# Patient Record
Sex: Female | Born: 1970 | Race: White | Hispanic: No | Smoking: Current every day smoker
Health system: Southern US, Community
[De-identification: ages and names within clinical notes are randomized; demographics above are authoritative.]

## PROBLEM LIST (undated history)

## (undated) ENCOUNTER — Emergency Department (HOSPITAL_COMMUNITY): Disposition: A | Discharge status: Home / Self Care | Payer: 59

## (undated) HISTORY — PX: ABLATION: SHX5711

---

## 2003-06-11 ENCOUNTER — Ambulatory Visit (HOSPITAL_COMMUNITY): Admission: RE | Admit: 2003-06-11 | Discharge: 2003-06-11 | Payer: Self-pay | Admitting: Obstetrics & Gynecology

## 2003-12-27 ENCOUNTER — Inpatient Hospital Stay (HOSPITAL_COMMUNITY): Admission: AD | Admit: 2003-12-27 | Discharge: 2003-12-30 | Payer: Self-pay | Admitting: Obstetrics

## 2003-12-31 ENCOUNTER — Encounter: Admission: RE | Admit: 2003-12-31 | Discharge: 2004-01-30 | Payer: Self-pay | Admitting: Obstetrics

## 2004-01-31 ENCOUNTER — Encounter: Admission: RE | Admit: 2004-01-31 | Discharge: 2004-03-01 | Payer: Self-pay | Admitting: Obstetrics

## 2004-04-01 ENCOUNTER — Encounter: Admission: RE | Admit: 2004-04-01 | Discharge: 2004-05-01 | Payer: Self-pay | Admitting: Obstetrics

## 2004-05-02 ENCOUNTER — Encounter: Admission: RE | Admit: 2004-05-02 | Discharge: 2004-06-01 | Payer: Self-pay | Admitting: Obstetrics

## 2004-06-30 ENCOUNTER — Encounter: Admission: RE | Admit: 2004-06-30 | Discharge: 2004-07-30 | Payer: Self-pay | Admitting: Obstetrics

## 2004-07-14 ENCOUNTER — Emergency Department (HOSPITAL_COMMUNITY): Admission: EM | Admit: 2004-07-14 | Discharge: 2004-07-14 | Payer: Self-pay | Admitting: Family Medicine

## 2004-08-30 ENCOUNTER — Encounter: Admission: RE | Admit: 2004-08-30 | Discharge: 2004-09-29 | Payer: Self-pay | Admitting: Obstetrics

## 2004-10-30 ENCOUNTER — Encounter: Admission: RE | Admit: 2004-10-30 | Discharge: 2004-11-29 | Payer: Self-pay | Admitting: Obstetrics

## 2004-11-30 ENCOUNTER — Encounter: Admission: RE | Admit: 2004-11-30 | Discharge: 2004-12-29 | Payer: Self-pay | Admitting: Obstetrics

## 2004-12-30 ENCOUNTER — Encounter: Admission: RE | Admit: 2004-12-30 | Discharge: 2005-01-29 | Payer: Self-pay | Admitting: Obstetrics

## 2005-01-30 ENCOUNTER — Encounter: Admission: RE | Admit: 2005-01-30 | Discharge: 2005-02-28 | Payer: Self-pay | Admitting: Obstetrics

## 2005-03-01 ENCOUNTER — Encounter: Admission: RE | Admit: 2005-03-01 | Discharge: 2005-03-31 | Payer: Self-pay | Admitting: Obstetrics

## 2005-04-01 ENCOUNTER — Encounter: Admission: RE | Admit: 2005-04-01 | Discharge: 2005-05-01 | Payer: Self-pay | Admitting: Obstetrics

## 2005-05-02 ENCOUNTER — Encounter: Admission: RE | Admit: 2005-05-02 | Discharge: 2005-05-29 | Payer: Self-pay | Admitting: Obstetrics

## 2005-05-30 ENCOUNTER — Encounter: Admission: RE | Admit: 2005-05-30 | Discharge: 2005-06-29 | Payer: Self-pay | Admitting: Obstetrics

## 2005-06-30 ENCOUNTER — Encounter: Admission: RE | Admit: 2005-06-30 | Discharge: 2005-07-24 | Payer: Self-pay | Admitting: Obstetrics

## 2008-03-14 ENCOUNTER — Observation Stay (HOSPITAL_COMMUNITY): Admission: AD | Admit: 2008-03-14 | Discharge: 2008-03-14 | Payer: Self-pay | Admitting: Obstetrics and Gynecology

## 2008-03-21 ENCOUNTER — Ambulatory Visit: Payer: Self-pay | Admitting: Hematology and Oncology

## 2008-03-30 LAB — URINALYSIS, MICROSCOPIC - CHCC
Bacteria, UA: NEGATIVE
Bilirubin (Urine): NEGATIVE
Blood: NEGATIVE
Glucose: NEGATIVE g/dL
Nitrite: NEGATIVE
Specific Gravity, Urine: 1.02 (ref 1.003–1.035)

## 2008-03-30 LAB — CBC & DIFF AND RETIC
BASO%: 0.4 % (ref 0.0–2.0)
Basophils Absolute: 0 10*3/uL (ref 0.0–0.1)
HCT: 38.6 % (ref 34.8–46.6)
HGB: 13 g/dL (ref 11.6–15.9)
IRF: 0.29 (ref 0.130–0.330)
LYMPH%: 36 % (ref 14.0–48.0)
MCHC: 33.6 g/dL (ref 32.0–36.0)
MONO#: 0.3 10*3/uL (ref 0.1–0.9)
NEUT%: 55.6 % (ref 39.6–76.8)
Platelets: 184 10*3/uL (ref 145–400)
WBC: 4 10*3/uL (ref 3.9–10.0)

## 2008-03-31 LAB — COMPREHENSIVE METABOLIC PANEL
ALT: <8 U/L (ref 0–35)
Albumin: 4.2 g/dL (ref 3.5–5.2)
CO2: 25 mEq/L (ref 19–32)
Calcium: 8.8 mg/dL (ref 8.4–10.5)
Chloride: 107 mEq/L (ref 96–112)
Glucose, Bld: 82 mg/dL (ref 70–99)
Potassium: 4.4 mEq/L (ref 3.5–5.3)
Sodium: 137 mEq/L (ref 135–145)
Total Protein: 6.4 g/dL (ref 6.0–8.3)

## 2008-03-31 LAB — IRON AND TIBC: Iron: 88 ug/dL (ref 42–145)

## 2008-03-31 LAB — HAPTOGLOBIN: Haptoglobin: 60 mg/dL (ref 16–200)

## 2008-03-31 LAB — FERRITIN: Ferritin: 94 ng/mL (ref 10–291)

## 2008-05-14 ENCOUNTER — Ambulatory Visit: Payer: Self-pay | Admitting: Hematology and Oncology

## 2008-05-16 LAB — BASIC METABOLIC PANEL
BUN: 6 mg/dL (ref 6–23)
Chloride: 102 mEq/L (ref 96–112)
Creatinine, Ser: 0.69 mg/dL (ref 0.40–1.20)
Glucose, Bld: 108 mg/dL — ABNORMAL HIGH (ref 70–99)

## 2008-05-16 LAB — CBC WITH DIFFERENTIAL/PLATELET
Basophils Absolute: 0 10*3/uL (ref 0.0–0.1)
EOS%: 0.2 % (ref 0.0–7.0)
HCT: 45.1 % (ref 34.8–46.6)
HGB: 15.4 g/dL (ref 11.6–15.9)
MCH: 28.8 pg (ref 25.1–34.0)
MCV: 84.2 fL (ref 79.5–101.0)
MONO%: 4.9 % (ref 0.0–14.0)
NEUT%: 78.5 % — ABNORMAL HIGH (ref 38.4–76.8)

## 2008-05-16 LAB — IRON AND TIBC
Iron: 119 ug/dL (ref 42–145)
UIBC: 182 ug/dL

## 2008-08-06 ENCOUNTER — Encounter: Admission: RE | Admit: 2008-08-06 | Discharge: 2008-08-06 | Payer: Self-pay | Admitting: Family Medicine

## 2008-09-25 ENCOUNTER — Ambulatory Visit: Payer: Self-pay | Admitting: Hematology and Oncology

## 2008-09-27 LAB — CBC WITH DIFFERENTIAL/PLATELET
Basophils Absolute: 0 10*3/uL (ref 0.0–0.1)
Eosinophils Absolute: 0 10*3/uL (ref 0.0–0.5)
LYMPH%: 26.4 % (ref 14.0–49.7)
MCV: 87.7 fL (ref 79.5–101.0)
MONO%: 6.4 % (ref 0.0–14.0)
NEUT#: 4.5 10*3/uL (ref 1.5–6.5)
Platelets: 199 10*3/uL (ref 145–400)
RBC: 4.83 10*6/uL (ref 3.70–5.45)

## 2008-09-27 LAB — BASIC METABOLIC PANEL
BUN: 5 mg/dL — ABNORMAL LOW (ref 6–23)
Calcium: 9.2 mg/dL (ref 8.4–10.5)
Creatinine, Ser: 0.79 mg/dL (ref 0.40–1.20)
Glucose, Bld: 79 mg/dL (ref 70–99)

## 2008-09-27 LAB — IRON AND TIBC: Iron: 122 ug/dL (ref 42–145)

## 2009-05-08 ENCOUNTER — Ambulatory Visit (HOSPITAL_COMMUNITY): Admission: RE | Admit: 2009-05-08 | Discharge: 2009-05-08 | Payer: Self-pay | Admitting: Obstetrics

## 2009-05-13 ENCOUNTER — Emergency Department (HOSPITAL_COMMUNITY): Admission: EM | Admit: 2009-05-13 | Discharge: 2009-05-13 | Payer: Self-pay | Admitting: Emergency Medicine

## 2009-07-20 ENCOUNTER — Emergency Department (HOSPITAL_COMMUNITY): Admission: EM | Admit: 2009-07-20 | Discharge: 2009-07-21 | Payer: Self-pay | Admitting: Emergency Medicine

## 2009-07-21 ENCOUNTER — Inpatient Hospital Stay (HOSPITAL_COMMUNITY): Admission: RE | Admit: 2009-07-21 | Discharge: 2009-07-27 | Payer: Self-pay | Admitting: Psychiatry

## 2009-07-21 ENCOUNTER — Ambulatory Visit: Payer: Self-pay | Admitting: Psychiatry

## 2009-07-31 ENCOUNTER — Other Ambulatory Visit (HOSPITAL_COMMUNITY): Admission: RE | Admit: 2009-07-31 | Discharge: 2009-10-29 | Payer: Self-pay | Admitting: Psychiatry

## 2009-08-15 ENCOUNTER — Ambulatory Visit: Payer: Self-pay | Admitting: Psychiatry

## 2009-11-22 ENCOUNTER — Ambulatory Visit (HOSPITAL_COMMUNITY): Payer: Self-pay | Admitting: Psychiatry

## 2009-12-01 ENCOUNTER — Emergency Department (HOSPITAL_COMMUNITY): Admission: EM | Admit: 2009-12-01 | Discharge: 2009-12-02 | Payer: Self-pay | Admitting: Emergency Medicine

## 2009-12-02 ENCOUNTER — Ambulatory Visit: Payer: Self-pay | Admitting: Psychiatry

## 2010-04-03 ENCOUNTER — Ambulatory Visit: Payer: Self-pay | Admitting: Hematology and Oncology

## 2010-04-07 LAB — IRON AND TIBC
%SAT: 42 % (ref 20–55)
Iron: 105 ug/dL (ref 42–145)
TIBC: 253 ug/dL (ref 250–470)
UIBC: 148 ug/dL

## 2010-04-07 LAB — COMPREHENSIVE METABOLIC PANEL
ALT: <8 U/L (ref 0–35)
AST: 15 U/L (ref 0–37)
Albumin: 4.3 g/dL (ref 3.5–5.2)
Alkaline Phosphatase: 72 U/L (ref 39–117)
BUN: 5 mg/dL — ABNORMAL LOW (ref 6–23)
CO2: 23 mEq/L (ref 19–32)
Calcium: 9.2 mg/dL (ref 8.4–10.5)
Chloride: 106 mEq/L (ref 96–112)
Creatinine, Ser: 0.71 mg/dL (ref 0.40–1.20)
Glucose, Bld: 84 mg/dL (ref 70–99)
Potassium: 4.2 mEq/L (ref 3.5–5.3)
Sodium: 139 mEq/L (ref 135–145)
Total Bilirubin: 0.3 mg/dL (ref 0.3–1.2)
Total Protein: 6.6 g/dL (ref 6.0–8.3)

## 2010-04-07 LAB — CBC WITH DIFFERENTIAL/PLATELET
BASO%: 1.2 % (ref 0.0–2.0)
Basophils Absolute: 0.1 10*3/uL (ref 0.0–0.1)
EOS%: 1.5 % (ref 0.0–7.0)
Eosinophils Absolute: 0.1 10*3/uL (ref 0.0–0.5)
HCT: 42.2 % (ref 34.8–46.6)
HGB: 14.2 g/dL (ref 11.6–15.9)
LYMPH%: 31.7 % (ref 14.0–49.7)
MCH: 30.5 pg (ref 25.1–34.0)
MCHC: 33.8 g/dL (ref 31.5–36.0)
MCV: 90.2 fL (ref 79.5–101.0)
MONO#: 0.4 10*3/uL (ref 0.1–0.9)
MONO%: 6.9 % (ref 0.0–14.0)
NEUT#: 3.4 10*3/uL (ref 1.5–6.5)
NEUT%: 58.7 % (ref 38.4–76.8)
Platelets: 174 10*3/uL (ref 145–400)
RBC: 4.67 10*6/uL (ref 3.70–5.45)
RDW: 13 % (ref 11.2–14.5)
WBC: 5.9 10*3/uL (ref 3.9–10.3)
lymph#: 1.9 10*3/uL (ref 0.9–3.3)

## 2010-04-07 LAB — FERRITIN: Ferritin: 43 ng/mL (ref 10–291)

## 2010-04-13 ENCOUNTER — Encounter: Payer: Self-pay | Admitting: Obstetrics & Gynecology

## 2010-04-14 ENCOUNTER — Encounter: Payer: Self-pay | Admitting: Family Medicine

## 2010-06-05 LAB — RAPID URINE DRUG SCREEN, HOSP PERFORMED
Amphetamines: NOT DETECTED
Barbiturates: NOT DETECTED
Cocaine: NOT DETECTED
Tetrahydrocannabinol: NOT DETECTED

## 2010-06-05 LAB — COMPREHENSIVE METABOLIC PANEL
AST: 21 U/L (ref 0–37)
BUN: 9 mg/dL (ref 6–23)
Chloride: 103 mEq/L (ref 96–112)
Creatinine, Ser: 0.77 mg/dL (ref 0.4–1.2)
Potassium: 3.5 mEq/L (ref 3.5–5.1)
Sodium: 137 mEq/L (ref 135–145)
Total Bilirubin: 0.4 mg/dL (ref 0.3–1.2)

## 2010-06-05 LAB — CBC
HCT: 42.9 % (ref 36.0–46.0)
MCHC: 34.9 g/dL (ref 30.0–36.0)

## 2010-06-05 LAB — DIFFERENTIAL
Basophils Absolute: 0 10*3/uL (ref 0.0–0.1)
Lymphocytes Relative: 16 % (ref 12–46)
Lymphs Abs: 1.2 10*3/uL (ref 0.7–4.0)

## 2010-06-05 LAB — ETHANOL: Alcohol, Ethyl (B): <5 mg/dL (ref 0–10)

## 2010-06-05 LAB — POCT PREGNANCY, URINE: Preg Test, Ur: NEGATIVE

## 2010-06-05 LAB — ACETAMINOPHEN LEVEL: Acetaminophen (Tylenol), Serum: <10 ug/mL — ABNORMAL LOW (ref 10–30)

## 2010-06-07 LAB — URINE DRUGS OF ABUSE SCREEN W ALC, ROUTINE (REF LAB)
Barbiturate Quant, Ur: NEGATIVE
Barbiturate Quant, Ur: NEGATIVE
Benzodiazepines.: NEGATIVE
Ethyl Alcohol: <10 mg/dL (ref <10)
Marijuana Metabolite: NEGATIVE
Methadone: NEGATIVE
Opiate Screen, Urine: NEGATIVE
Propoxyphene: NEGATIVE

## 2010-06-08 LAB — URINE DRUGS OF ABUSE SCREEN W ALC, ROUTINE (REF LAB)
Amphetamine Screen, Ur: NEGATIVE
Amphetamine Screen, Ur: NEGATIVE
Barbiturate Quant, Ur: NEGATIVE
Barbiturate Quant, Ur: NEGATIVE
Benzodiazepines.: NEGATIVE
Creatinine,U: 263.1 mg/dL
Ethyl Alcohol: <10 mg/dL (ref <10)
Marijuana Metabolite: NEGATIVE
Methadone: NEGATIVE
Opiate Screen, Urine: NEGATIVE
Opiate Screen, Urine: NEGATIVE
Phencyclidine (PCP): NEGATIVE
Phencyclidine (PCP): NEGATIVE
Propoxyphene: NEGATIVE

## 2010-06-09 LAB — BENZODIAZEPINE, QUANTITATIVE, URINE
Alprazolam (GC/LC/MS), ur confirm: NEGATIVE NG/ML
Nordiazepam GC/MS Conf: NEGATIVE NG/ML
Nordiazepam GC/MS Conf: NEGATIVE NG/ML
Oxazepam GC/MS Conf: 4935 NG/ML — ABNORMAL HIGH
Temazepam GC/MS Conf: NEGATIVE NG/ML

## 2010-06-09 LAB — URINE DRUGS OF ABUSE SCREEN W ALC, ROUTINE (REF LAB)
Amphetamine Screen, Ur: NEGATIVE
Amphetamine Screen, Ur: NEGATIVE
Amphetamine Screen, Ur: NEGATIVE
Amphetamine Screen, Ur: NEGATIVE
Barbiturate Quant, Ur: NEGATIVE
Barbiturate Quant, Ur: NEGATIVE
Barbiturate Quant, Ur: NEGATIVE
Benzodiazepines.: NEGATIVE
Benzodiazepines.: POSITIVE — AB
Cocaine Metabolites: NEGATIVE
Creatinine,U: 167.6 mg/dL
Creatinine,U: 144.1 mg/dL
Ethyl Alcohol: <10 mg/dL (ref <10)
Marijuana Metabolite: NEGATIVE
Marijuana Metabolite: NEGATIVE
Methadone: NEGATIVE
Methadone: NEGATIVE
Phencyclidine (PCP): NEGATIVE
Phencyclidine (PCP): NEGATIVE
Phencyclidine (PCP): NEGATIVE
Propoxyphene: NEGATIVE
Propoxyphene: NEGATIVE
Propoxyphene: NEGATIVE
Propoxyphene: NEGATIVE

## 2010-06-10 LAB — COMPREHENSIVE METABOLIC PANEL
ALT: 9 U/L (ref 0–35)
BUN: 6 mg/dL (ref 6–23)
CO2: 27 mEq/L (ref 19–32)
Calcium: 9.3 mg/dL (ref 8.4–10.5)
Creatinine, Ser: 0.73 mg/dL (ref 0.4–1.2)
GFR calc non Af Amer: >60 mL/min (ref >60)
Glucose, Bld: 73 mg/dL (ref 70–99)

## 2010-06-10 LAB — RAPID URINE DRUG SCREEN, HOSP PERFORMED
Amphetamines: NOT DETECTED
Barbiturates: NOT DETECTED
Opiates: POSITIVE — AB

## 2010-06-10 LAB — CBC
HCT: 41.7 % (ref 36.0–46.0)
Hemoglobin: 14.1 g/dL (ref 12.0–15.0)
MCHC: 33.9 g/dL (ref 30.0–36.0)
MCV: 97.9 fL (ref 78.0–100.0)
RBC: 4.26 MIL/uL (ref 3.87–5.11)

## 2010-06-10 LAB — DIFFERENTIAL
Eosinophils Absolute: 0 10*3/uL (ref 0.0–0.7)
Lymphocytes Relative: 25 % (ref 12–46)
Lymphs Abs: 1.9 10*3/uL (ref 0.7–4.0)
Neutro Abs: 4.9 10*3/uL (ref 1.7–7.7)
Neutrophils Relative %: 66 % (ref 43–77)

## 2010-06-10 LAB — TSH: TSH: 1.456 u[IU]/mL (ref 0.350–4.500)

## 2011-01-01 ENCOUNTER — Emergency Department (HOSPITAL_COMMUNITY)
Admission: EM | Admit: 2011-01-01 | Discharge: 2011-01-01 | Disposition: A | Discharge status: Home / Self Care | Payer: 59 | Attending: Emergency Medicine | Admitting: Emergency Medicine

## 2011-01-01 DIAGNOSIS — R000 Tachycardia, unspecified: Secondary | ICD-10-CM | POA: Insufficient documentation

## 2011-01-01 DIAGNOSIS — R569 Unspecified convulsions: Secondary | ICD-10-CM | POA: Insufficient documentation

## 2011-01-01 DIAGNOSIS — Z9889 Other specified postprocedural states: Secondary | ICD-10-CM | POA: Insufficient documentation

## 2011-01-01 DIAGNOSIS — F1120 Opioid dependence, uncomplicated: Secondary | ICD-10-CM | POA: Insufficient documentation

## 2011-01-01 DIAGNOSIS — F17200 Nicotine dependence, unspecified, uncomplicated: Secondary | ICD-10-CM | POA: Insufficient documentation

## 2011-01-01 LAB — RAPID URINE DRUG SCREEN, HOSP PERFORMED
Barbiturates: NOT DETECTED
Benzodiazepines: NOT DETECTED
Cocaine: NOT DETECTED

## 2011-05-16 IMAGING — US US TRANSVAGINAL NON-OB
1 series · 14 of 25 positions shown · non-contrast
Comparison: No similar prior study is available for comparison.

CLINICAL DATA: Pelvic pain

TRANSABDOMINAL AND TRANSVAGINAL ULTRASOUND OF PELVIS
TECHNIQUE: Both transabdominal and transvaginal ultrasound
examinations of the pelvis were performed including evaluation of
the uterus, ovaries, adnexal regions, and pelvic cul-de-sac.

[Series 1: us pelvis complete modify · 14 of 71 slices shown]
[im 1/71]
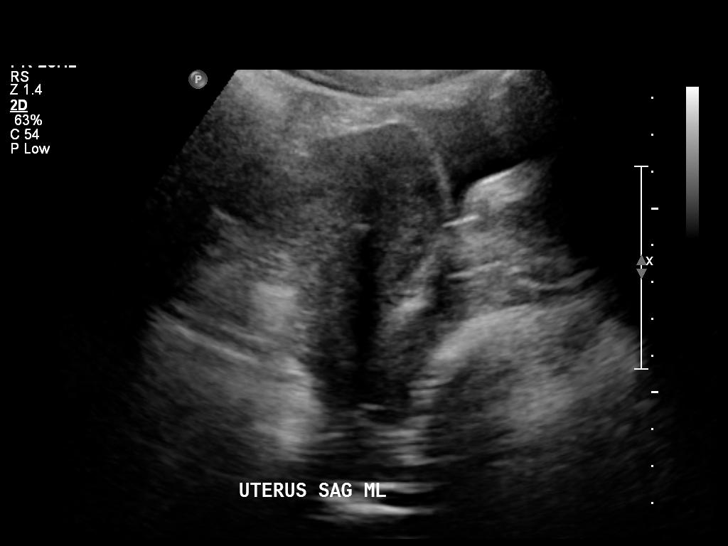
[im 6/71]
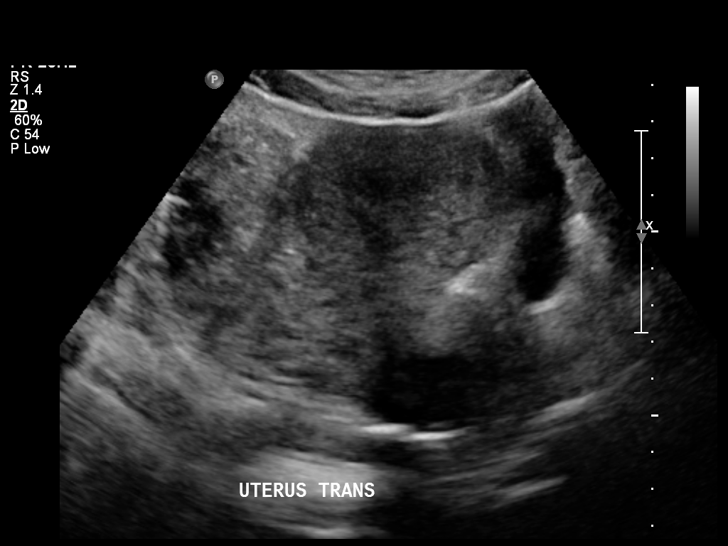
[im 12/71]
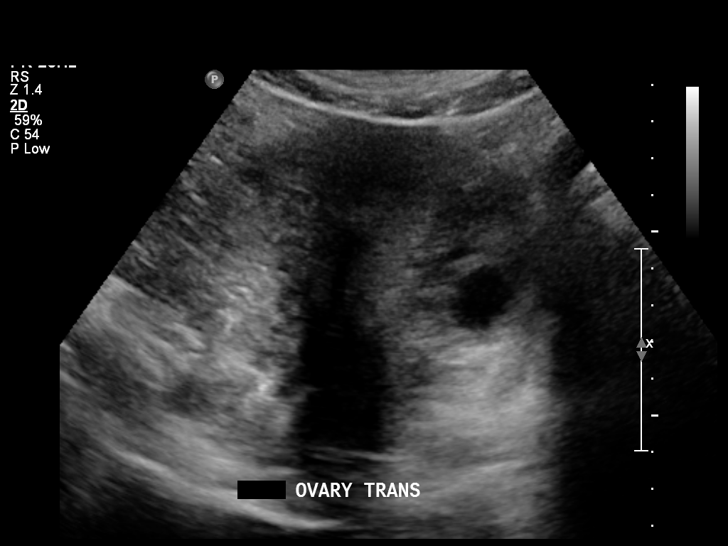
[im 18/71]
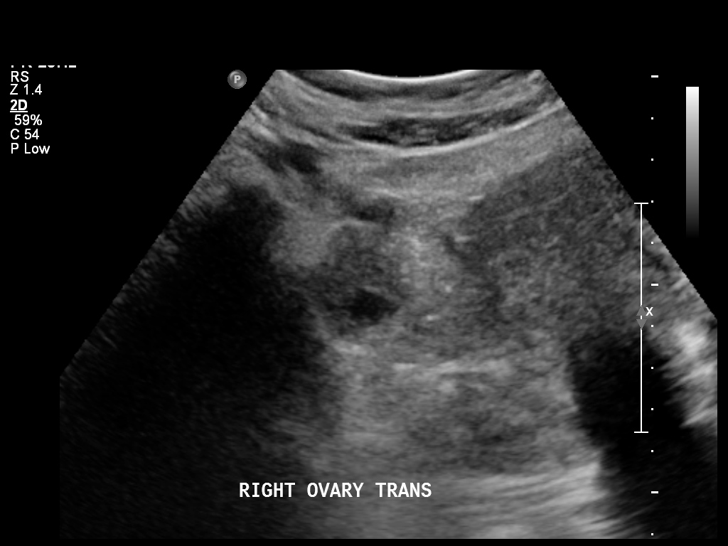
[im 24/71]
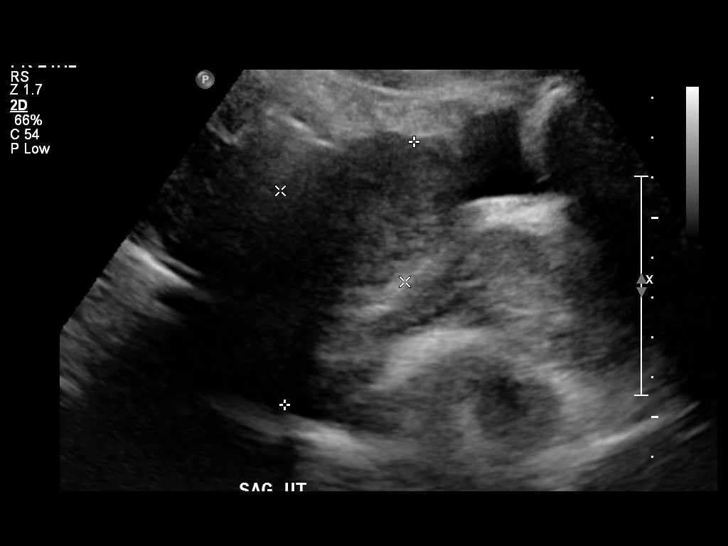
[im 27/71]
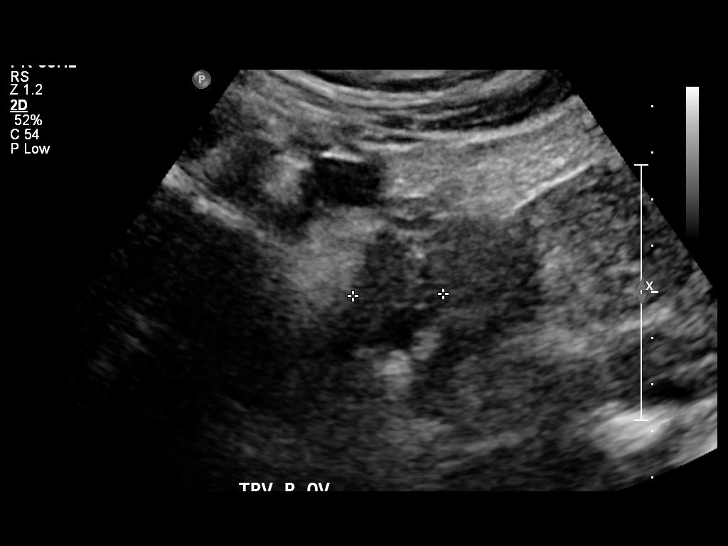
[im 33/71]
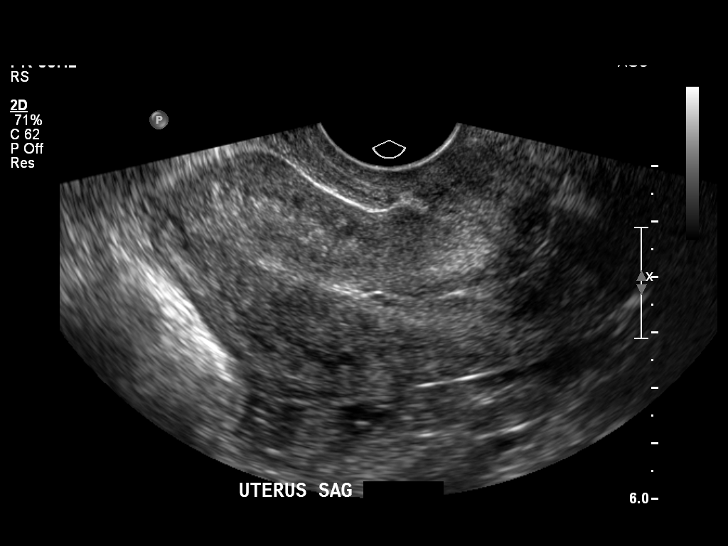
[im 38/71]
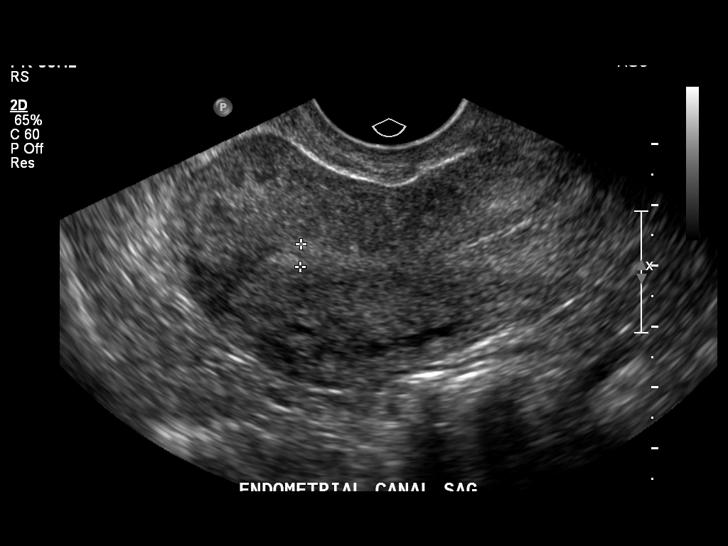
[im 44/71]
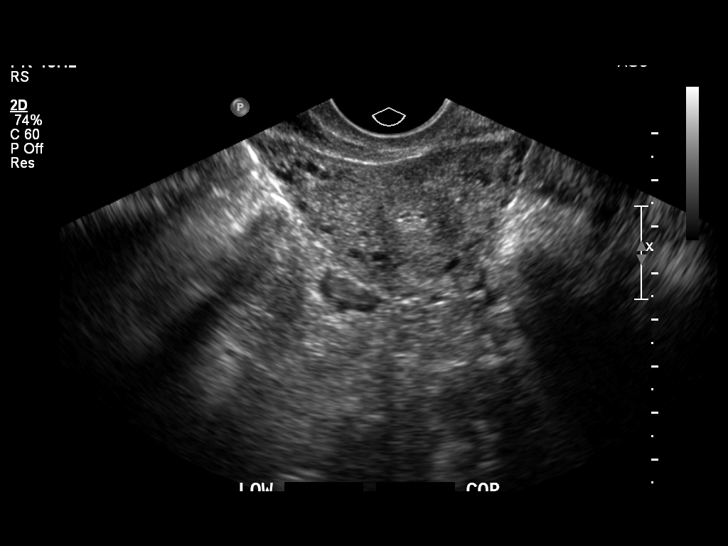
[im 47/71]
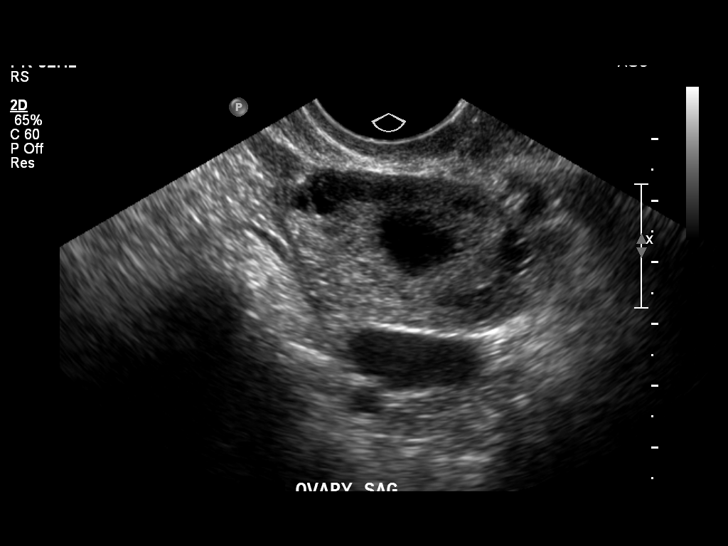
[im 53/71]
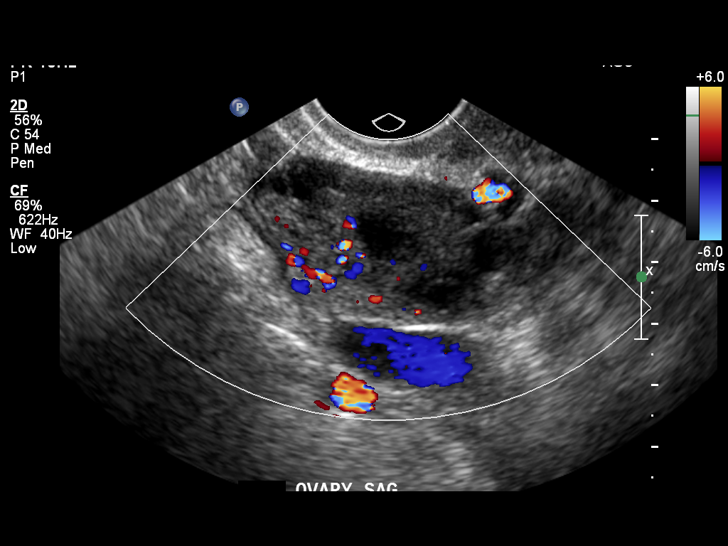
[im 59/71]
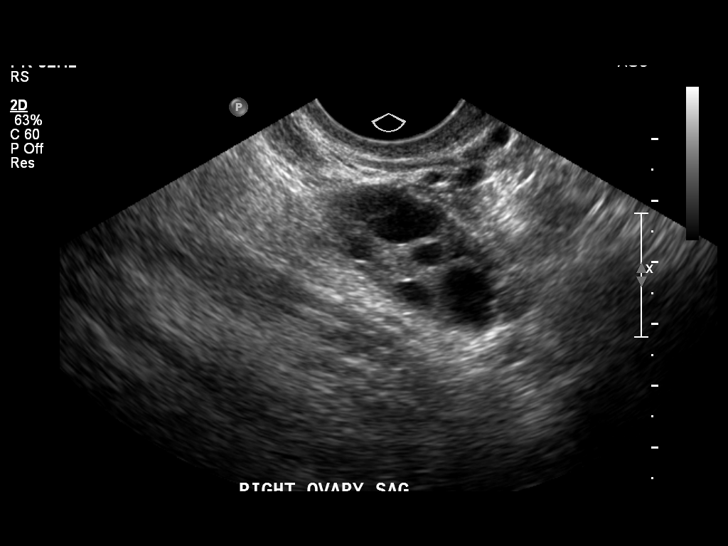
[im 65/71]
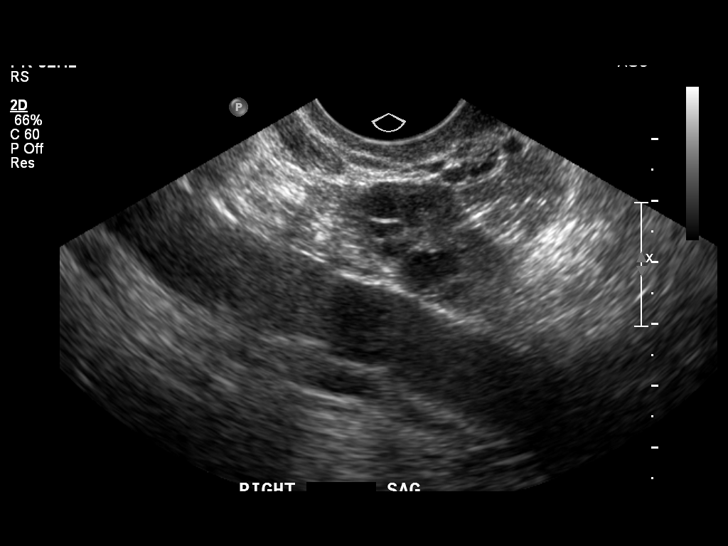
[im 71/71]
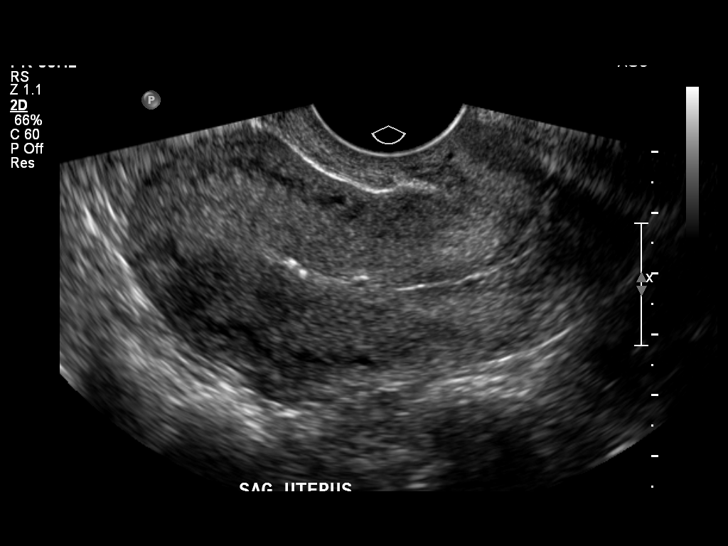

[14 of 25 positions shown; findings below may reference images not displayed]

FINDINGS: Uterus 8.5 x 5.7 x 3.4 cm.  Arcuate versus subseptate morphology is
incidentally noted.  The uterine fundus is convex.  No focal
abnormality.

Endometrium 4 mm, uniformly echogenic.

Right Ovary 4.3 x 2.0 x 1.8 cm, normal.

Left Ovary 4.6 x 2.9 x 2.7 cm, normal.

Other Findings:  No free fluid.
IMPRESSION: No acute finding.  Arcuate versus subseptate uterine morphology
incidentally noted.

## 2011-07-14 ENCOUNTER — Ambulatory Visit: Payer: 59 | Admitting: Family

## 2011-07-14 DIAGNOSIS — Z0289 Encounter for other administrative examinations: Secondary | ICD-10-CM

## 2015-12-01 ENCOUNTER — Emergency Department (INDEPENDENT_AMBULATORY_CARE_PROVIDER_SITE_OTHER)
Admission: EM | Admit: 2015-12-01 | Discharge: 2015-12-01 | Disposition: A | Discharge status: Home / Self Care | Payer: Medicaid Other | Source: Home / Self Care | Attending: Family Medicine | Admitting: Family Medicine

## 2015-12-01 ENCOUNTER — Encounter: Payer: Self-pay | Admitting: Emergency Medicine

## 2015-12-01 DIAGNOSIS — S71112A Laceration without foreign body, left thigh, initial encounter: Secondary | ICD-10-CM | POA: Diagnosis not present

## 2015-12-01 NOTE — ED Provider Notes (Signed)
CSN: [id number]     Arrival date & time 12/01/15  1638 History   First MD Initiated Contact with Patient 12/01/15 1723     Chief Complaint  Patient presents with  . Laceration   (Consider location/radiation/quality/duration/timing/severity/associated sxs/prior Treatment) HPI Jaclyn Chavez is a 45 y.o. female presenting to UC with c/o laceration to Left anterior thigh that occurred about 30 minutes PTA.  Pt cut it on a glass picture frame. Bleeding controlled PTA with direct pressure. Wound does keep bleeding when she walks due to location of wound. Pain is mild burning sensation. Last tetanus 4 years ago.   History reviewed. No pertinent past medical history. Past Surgical History:  Procedure Laterality Date  . ABLATION     No family history on file. Social History  Substance Use Topics  . Smoking status: Current Every Day Smoker    Packs/day: 0.50    Types: Cigarettes  . Smokeless tobacco: Never Used  . Alcohol use Yes   OB History    No data available     Review of Systems  Musculoskeletal: Negative for arthralgias, joint swelling and myalgias.  Skin: Positive for wound ( laceration to Left thigh). Negative for color change.  Neurological: Negative for weakness and numbness.    Allergies  Review of patient's allergies indicates no known allergies.  Home Medications   Prior to Admission medications   Not on File   Meds Ordered and Administered this Visit  Medications - No data to display  BP 124/77 (BP Location: Left Arm)   Pulse 99   Temp 98.4 F (36.9 C) (Oral)   Ht 5\' 5"  (1.651 m)   Wt 128 lb (58.1 kg)   SpO2 99%   BMI 21.30 kg/m  No data found.   Physical Exam  Constitutional: She is oriented to person, place, and time. She appears well-developed and well-nourished.  HENT:  Head: Normocephalic and atraumatic.  Eyes: EOM are normal.  Neck: Normal range of motion.  Cardiovascular: Normal rate.   Pulmonary/Chest: Effort normal.  Musculoskeletal:  Normal range of motion. She exhibits no edema or tenderness.  Left knee: full ROM  Neurological: She is alert and oriented to person, place, and time.  Skin: Skin is warm and dry.  Left anterior thigh: 1cm laceration, straight edges. Adipose tissue exposed. No muscle or tendon involvement. No foreign bodies seen or palpated.  Bleeding controlled.  Psychiatric: She has a normal mood and affect. Her behavior is normal.  Nursing note and vitals reviewed.   Urgent Care Course   Clinical Course    .Marland KitchenLaceration Repair Date/Time: 12/01/2015 5:40 PM Performed by: Junius Finner Authorized by: Donna Christen A   Consent:    Consent obtained:  Verbal   Consent given by:  Patient   Risks discussed:  Infection and pain   Alternatives discussed:  No treatment, delayed treatment and observation Anesthesia (see MAR for exact dosages):    Anesthesia method:  Local infiltration   Local anesthetic:  Lidocaine 1% WITH epi Laceration details:    Location:  Leg   Leg location:  L upper leg   Length (cm):  1   Depth (mm):  2 Repair type:    Repair type:  Simple Pre-procedure details:    Preparation:  Patient was prepped and draped in usual sterile fashion Exploration:    Hemostasis achieved with:  Direct pressure   Wound exploration: wound explored through full range of motion and entire depth of wound probed and visualized  Wound extent: no foreign bodies/material noted, no muscle damage noted, no nerve damage noted, no tendon damage noted and no vascular damage noted     Contaminated: no   Treatment:    Area cleansed with:  Saline   Amount of cleaning:  Standard   Irrigation solution:  Sterile saline   Irrigation method:  Syringe   Visualized foreign bodies/material removed: no   Skin repair:    Repair method:  Sutures   Suture size:  4-0   Suture material:  Prolene   Number of sutures:  2 Approximation:    Approximation:  Close   Vermilion border: well-aligned   Post-procedure  details:    Dressing:  Antibiotic ointment and adhesive bandage   Patient tolerance of procedure:  Tolerated well, no immediate complications   (including critical care time)  Labs Review Labs Reviewed - No data to display  Imaging Review No results found.    MDM   1. Laceration of left thigh, initial encounter    Laceration to Left anterior thigh.  Wound cleaned. No foreign bodies seen or palpated.  No muscle or tendon involvement.  Sutured closed w/o complication [facility]-14 days, sooner if signs of infection.    Junius FinnerErin O'Malley, PA-C 12/01/15 [phone]

## 2015-12-01 NOTE — ED Triage Notes (Signed)
Pt dropped a picture frame on her left knee and has a cut that may need sutures.  TD x 4 years ago.

## 2015-12-04 ENCOUNTER — Ambulatory Visit (HOSPITAL_BASED_OUTPATIENT_CLINIC_OR_DEPARTMENT_OTHER)
Admission: RE | Admit: 2015-12-04 | Discharge: 2015-12-04 | Disposition: A | Discharge status: Home / Self Care | Payer: Medicaid Other | Source: Ambulatory Visit | Attending: Family Medicine | Admitting: Family Medicine

## 2015-12-04 ENCOUNTER — Encounter: Payer: Self-pay | Admitting: Emergency Medicine

## 2015-12-04 ENCOUNTER — Emergency Department
Admission: EM | Admit: 2015-12-04 | Discharge: 2015-12-04 | Disposition: A | Discharge status: Home / Self Care | Payer: Medicaid Other | Source: Home / Self Care | Attending: Family Medicine | Admitting: Family Medicine

## 2015-12-04 DIAGNOSIS — M79662 Pain in left lower leg: Secondary | ICD-10-CM | POA: Diagnosis present

## 2015-12-04 DIAGNOSIS — I8392 Asymptomatic varicose veins of left lower extremity: Secondary | ICD-10-CM | POA: Insufficient documentation

## 2015-12-04 DIAGNOSIS — M5432 Sciatica, left side: Secondary | ICD-10-CM

## 2015-12-04 DIAGNOSIS — I8390 Asymptomatic varicose veins of unspecified lower extremity: Secondary | ICD-10-CM

## 2015-12-04 DIAGNOSIS — M79605 Pain in left leg: Secondary | ICD-10-CM

## 2015-12-04 MED ORDER — MELOXICAM 7.5 MG PO TABS: 7.5000 mg | ORAL_TABLET | Freq: Every day | ORAL | 0 refills | Status: AC

## 2015-12-04 MED ORDER — TRAMADOL HCL 50 MG PO TABS: 50.0000 mg | ORAL_TABLET | Freq: Four times a day (QID) | ORAL | 0 refills | Status: AC | PRN

## 2015-12-04 MED ORDER — PREDNISONE 20 MG PO TABS: ORAL_TABLET | ORAL | 0 refills | Status: AC

## 2015-12-04 MED FILL — MELOXICAM 7.5 MG TABLET: 7.5 | 15 days supply | Qty: 30 | Fill #0

## 2015-12-04 MED FILL — traMADol HCL 50 MG TABS: 50 | 4 days supply | Qty: 15 | Fill #0

## 2015-12-04 NOTE — ED Triage Notes (Signed)
Left leg pain started about 2 weeks ago, numbness, tingling, veins protruding around the knee, feels like a burning sensation, had sutures put in on Sunday, but says this started before then.

## 2015-12-04 NOTE — ED Provider Notes (Signed)
CSN: [id number]     Arrival date & time 12/04/15  1001 History   First MD Initiated Contact with Patient 12/04/15 1025     Chief Complaint  Patient presents with  . Leg Pain   (Consider location/radiation/quality/duration/timing/severity/associated sxs/prior Treatment) HPI Jaclyn Chavez is a 45 y.o. female presenting to UC with c/o Left leg pain that has ben mild intermittent for about 2 weeks but woke her from her sleep last night.  Pain is a burning sensation to lateral aspect of thigh that radiates down her leg. She also reports "heaviness" sensation in her leg and noticed veins bulging in the front of her Left knee. No known injury.  Pain is 7/10. Nothing seems to make it better or worse, however, no pain medication taken PTA.  Denies back pain. Denies hx of blood clots but states she did research online and has some concern for a blood clot. Denies chest pain or SOB. No leg swelling, recent surgeries, or recent travel.    History reviewed. No pertinent past medical history. Past Surgical History:  Procedure Laterality Date  . ABLATION     No family history on file. Social History  Substance Use Topics  . Smoking status: Current Every Day Smoker    Packs/day: 0.50    Types: Cigarettes  . Smokeless tobacco: Never Used  . Alcohol use Yes   OB History    No data available     Review of Systems  Constitutional: Negative for chills and fever.  Musculoskeletal: Positive for arthralgias and myalgias. Negative for gait problem and joint swelling.       Left leg pain and knee soreness.  Skin: Negative for color change, rash and wound.  Neurological: Positive for numbness. Negative for syncope and weakness.    Allergies  Review of patient's allergies indicates no known allergies.  Home Medications   Prior to Admission medications   Medication Sig Start Date End Date Taking? Authorizing Provider  meloxicam (MOBIC) 7.5 MG tablet Take 1-2 tablets (7.5-15 mg total) by mouth  daily. Take 2 tabs for 5 days, then 1-2 tabs daily as needed for pain 12/04/15   Junius Finner, PA-C  predniSONE (DELTASONE) 20 MG tablet 3 tabs po day one, then 2 po daily x 4 days 12/04/15   Junius Finner, PA-C  traMADol (ULTRAM) 50 MG tablet Take 1 tablet (50 mg total) by mouth every 6 (six) hours as needed. 12/04/15   Junius Finner, PA-C   Meds Ordered and Administered this Visit  Medications - No data to display  BP 92/59 (BP Location: Left Arm)   Pulse 92   Temp 98.3 F (36.8 C) (Oral)   Ht 5\' 2"  (1.575 m)   Wt 143 lb (64.9 kg)   SpO2 99%   BMI 26.16 kg/m  No data found.   Physical Exam  Constitutional: She is oriented to person, place, and time. She appears well-developed and well-nourished.  HENT:  Head: Normocephalic and atraumatic.  Eyes: EOM are normal.  Neck: Normal range of motion.  Cardiovascular: Normal rate.   Pulmonary/Chest: Effort normal.  Musculoskeletal: Normal range of motion. She exhibits tenderness. She exhibits no edema.  Left leg: mild tenderness to lateral aspect of thigh and calf. Mild tenderness to calf and anterior lower leg. Full ROM hip, knee and ankle. Normal gait. Muscle compartments are soft.  Neurological: She is alert and oriented to person, place, and time.  Skin: Skin is warm and dry. No erythema.  Left leg: well healing  small laceration to distal anterior thigh. Two sutures in place. Clean and dry. Bulging tortuous veins about 2 inches long running vertically down anterior knee noted on exam. Mildly tender. No erythema or warmth.   Psychiatric: She has a normal mood and affect. Her behavior is normal.  Nursing note and vitals reviewed.   Urgent Care Course   Clinical Course    Procedures (including critical care time)  Labs Review Labs Reviewed - No data to display  Imaging Review No results found.   MDM   1. Sciatica of left side   2. Left leg pain   3. Varicose veins    Pt c/o gradually worsening Left leg pain that is  sharp and burning.  Hx c/w sciatic pain, however, pt concerned for blood clots due to "research" online as well as noticing new varicose veins on anterior Left knee.  Low suspicion for DVT, however, due to calf tenderness and new pain w/o known injury, will order U/S to r/o DVT.  [facility]  U/S: negative for DVT  Will treat as sciatica and have pt f/u with PCP, recommended Dr. Denyse Amassorey, Sports Medicine, in 1-2 weeks if not improving. Pt info packet with home exercises provided. Rx: prednisone, tramadol, and meloxicam. Patient verbalized understanding and agreement with treatment plan.     Junius Finnerrin O'Malley, PA-C 12/04/15 1202

## 2015-12-04 NOTE — Discharge Instructions (Signed)
Tramadol is strong pain medication. While taking, do not drink alcohol, drive, or perform any other activities that requires focus while taking these medications.  ° °Meloxicam (Mobic) is an antiinflammatory to help with pain and inflammation.  Do not take ibuprofen, Advil, Aleve, or any other medications that contain NSAIDs while taking meloxicam as this may cause stomach upset or even ulcers if taken in large amounts for an extended period of time.  ° ° °

## 2016-04-29 ENCOUNTER — Ambulatory Visit (HOSPITAL_COMMUNITY): Payer: Medicaid Other | Admitting: Licensed Clinical Social Worker

## 2016-04-29 ENCOUNTER — Telehealth (HOSPITAL_COMMUNITY): Payer: Self-pay | Admitting: *Deleted

## 2016-04-29 NOTE — Telephone Encounter (Signed)
Lvm for pt to return call to office to reschedule apt.

## 2016-05-12 ENCOUNTER — Ambulatory Visit (HOSPITAL_COMMUNITY): Payer: Medicaid Other | Admitting: Licensed Clinical Social Worker

## 2016-05-16 ENCOUNTER — Encounter: Payer: Self-pay | Admitting: Emergency Medicine

## 2016-05-16 ENCOUNTER — Emergency Department (INDEPENDENT_AMBULATORY_CARE_PROVIDER_SITE_OTHER)
Admission: EM | Admit: 2016-05-16 | Discharge: 2016-05-16 | Disposition: A | Discharge status: Home / Self Care | Payer: Medicaid Other | Source: Home / Self Care | Attending: Family Medicine | Admitting: Family Medicine

## 2016-05-16 DIAGNOSIS — F32A Depression, unspecified: Secondary | ICD-10-CM

## 2016-05-16 DIAGNOSIS — F418 Other specified anxiety disorders: Secondary | ICD-10-CM | POA: Diagnosis not present

## 2016-05-16 DIAGNOSIS — R252 Cramp and spasm: Secondary | ICD-10-CM

## 2016-05-16 DIAGNOSIS — I8393 Asymptomatic varicose veins of bilateral lower extremities: Secondary | ICD-10-CM

## 2016-05-16 DIAGNOSIS — F4321 Adjustment disorder with depressed mood: Secondary | ICD-10-CM

## 2016-05-16 DIAGNOSIS — F329 Major depressive disorder, single episode, unspecified: Secondary | ICD-10-CM

## 2016-05-16 DIAGNOSIS — F419 Anxiety disorder, unspecified: Principal | ICD-10-CM

## 2016-05-16 DIAGNOSIS — Z634 Disappearance and death of family member: Secondary | ICD-10-CM

## 2016-05-16 MED ORDER — PREDNISONE 20 MG PO TABS: ORAL_TABLET | ORAL | 0 refills | Status: AC

## 2016-05-16 NOTE — ED Provider Notes (Signed)
CSN: [id number]     Arrival date & time 05/16/16  1735 History   First MD Initiated Contact with Patient 05/16/16 1801     Chief Complaint  Patient presents with  . Varicose Veins  . Anxiety   (Consider location/radiation/quality/duration/timing/severity/associated sxs/prior Treatment) HPI Jaclyn Chavez is a 46 y.o. female presenting to UC with c/o Left upper leg pain and burning at times, which she believes is secondary to her varicose veins.  Symptoms have been intermittent for about 4 months.  She was seen at this facility previously for same pain but in lower aspect of her leg.  She had a venous doppler that was negative for DVT.  Pt has tried to f/u with PCP but was advised it was 6 months out when she initially called, that appointment is still about 2 months away.  Pt also c/o anxiety and depression for the last 40 days after her son died of a drug over dose 40 days ago.  Pt tried to f/u with [facility] to [facility] but received a call they had to reschedule her appointment 2 weeks from now.  She is not currently on anxiety or depression medication. Denies HI or SI.    History reviewed. No pertinent past medical history. Past Surgical History:  Procedure Laterality Date  . ABLATION     History reviewed. No pertinent family history. Social History  Substance Use Topics  . Smoking status: Current Every Day Smoker    Packs/day: 0.50    Types: Cigarettes  . Smokeless tobacco: Never Used  . Alcohol use Yes   OB History    No data available     Review of Systems  Constitutional: Negative for chills and fever.  Respiratory: Negative for chest tightness and shortness of breath.   Cardiovascular: Negative for chest pain, palpitations and leg swelling.  Gastrointestinal: Negative for diarrhea, nausea and vomiting.  Musculoskeletal: Positive for myalgias ( Left leg/thigh cramping). Negative for arthralgias and joint swelling.  Skin: Negative for color  change and rash.  Psychiatric/Behavioral: Positive for dysphoric mood and sleep disturbance. Negative for hallucinations, self-injury and suicidal ideas. The patient is nervous/anxious.     Allergies  Patient has no known allergies.  Home Medications   Prior to Admission medications   Medication Sig Start Date End Date Taking? Authorizing Provider  meloxicam (MOBIC) 7.5 MG tablet Take 1-2 tablets (7.5-15 mg total) by mouth daily. Take 2 tabs for 5 days, then 1-2 tabs daily as needed for pain 12/04/15   Junius Finner, PA-C  predniSONE (DELTASONE) 20 MG tablet 3 tabs po day one, then 2 po daily x 4 days 12/04/15   Junius Finner, PA-C  predniSONE (DELTASONE) 20 MG tablet 3 tabs po day one, then 2 po daily x 4 days 05/16/16   Junius Finner, PA-C  traMADol (ULTRAM) 50 MG tablet Take 1 tablet (50 mg total) by mouth every 6 (six) hours as needed. 12/04/15   Junius Finner, PA-C   Meds Ordered and Administered this Visit  Medications - No data to display  BP 103/69 (BP Location: Left Arm)   Pulse 110   Temp 98 F (36.7 C) (Oral)   Resp 16   Ht 5\' 5"  (1.651 m)   Wt 128 lb (58.1 kg)   SpO2 97%   BMI 21.30 kg/m  No data found.   Physical Exam  Constitutional: She is oriented to person, place, and time. She appears well-developed and well-nourished. No distress.  tearful  HENT:  Head: Normocephalic and atraumatic.  Mouth/Throat: Oropharynx is clear and moist.  Eyes: EOM are normal.  Neck: Normal range of motion.  Cardiovascular: Normal rate and regular rhythm.   Pulmonary/Chest: Effort normal and breath sounds normal. No respiratory distress. She has no wheezes. She has no rales.  Musculoskeletal: Normal range of motion. She exhibits tenderness. She exhibits no edema.  Left thigh: tenderness to anterior aspect. Full ROM knee Left calf- soft, non-tender.  Neurological: She is alert and oriented to person, place, and time.  Skin: Skin is warm and dry. She is not diaphoretic.  Left  leg/anterior thigh: several various veins present. Mild tenderness. No erythema or warmth. No edema.  Psychiatric: Her behavior is normal. Her mood appears anxious. She exhibits a depressed mood ( tearful).  Nursing note and vitals reviewed.   Urgent Care Course     Procedures (including critical care time)  Labs Review Labs Reviewed - No data to display  Imaging Review No results found.   MDM   1. Anxiety and depression   2. Grief at loss of child   3. Varicose veins of legs   4. Leg cramps    Pt c/o Left leg cramping. She has already had negative venous doppler in the past.   Pt also c/o anxiety and depression. Notes she got a call from behavioral health about moving her appointment to 2 weeks from now.  Per medical records, [facility]. Pt is not currently on antianxiety or anti-depression medication and does not currently have a PCP.  Do not feel comfortable prescribing those medications in UC setting w/o known close f/u. [facility] if she feels she cannot wait until Monday [facility].   Offered resources for [employer], pt notes she has several resources already.    Junius Finnerrin O'Malley, PA-C 05/17/16 1126

## 2016-05-16 NOTE — ED Triage Notes (Signed)
Patient presents [facility] with C/O veins in the left leg burning and inflamed times 4 months..... She also C/O anxiety and depression times 40 days after the death of her  Son 40 days ago from a drug over dose.

## 2016-05-18 ENCOUNTER — Telehealth: Payer: Self-pay | Admitting: *Deleted

## 2016-05-18 NOTE — Telephone Encounter (Signed)
No answer, LMOM f/u from visit, inquired about PCP visit until behavioral heath appointment. Call back as needed.

## 2016-05-27 ENCOUNTER — Ambulatory Visit (HOSPITAL_COMMUNITY): Payer: Medicaid Other | Admitting: Licensed Clinical Social Worker

## 2016-06-02 ENCOUNTER — Ambulatory Visit: Payer: Medicaid Other | Admitting: Physician Assistant

## 2017-11-17 ENCOUNTER — Emergency Department (HOSPITAL_COMMUNITY)
Admission: EM | Admit: 2017-11-17 | Discharge: 2017-11-18 | Disposition: A | Discharge status: Home / Self Care | Payer: Medicaid Other | Attending: Emergency Medicine | Admitting: Emergency Medicine

## 2017-11-17 ENCOUNTER — Encounter (HOSPITAL_COMMUNITY): Payer: Self-pay | Admitting: Emergency Medicine

## 2017-11-17 DIAGNOSIS — F17210 Nicotine dependence, cigarettes, uncomplicated: Secondary | ICD-10-CM | POA: Diagnosis not present

## 2017-11-17 DIAGNOSIS — Z634 Disappearance and death of family member: Secondary | ICD-10-CM | POA: Diagnosis not present

## 2017-11-17 DIAGNOSIS — F32A Depression, unspecified: Secondary | ICD-10-CM | POA: Diagnosis present

## 2017-11-17 DIAGNOSIS — F4321 Adjustment disorder with depressed mood: Secondary | ICD-10-CM

## 2017-11-17 DIAGNOSIS — F329 Major depressive disorder, single episode, unspecified: Secondary | ICD-10-CM | POA: Diagnosis present

## 2017-11-17 DIAGNOSIS — F314 Bipolar disorder, current episode depressed, severe, without psychotic features: Secondary | ICD-10-CM | POA: Diagnosis not present

## 2017-11-17 DIAGNOSIS — Z79899 Other long term (current) drug therapy: Secondary | ICD-10-CM | POA: Diagnosis not present

## 2017-11-17 DIAGNOSIS — F332 Major depressive disorder, recurrent severe without psychotic features: Secondary | ICD-10-CM

## 2017-11-17 DIAGNOSIS — F419 Anxiety disorder, unspecified: Secondary | ICD-10-CM | POA: Diagnosis not present

## 2017-11-17 LAB — CBC WITH DIFFERENTIAL/PLATELET
Basophils Absolute: 0 10*3/uL (ref 0.0–0.1)
Basophils Relative: 0 %
Eosinophils Absolute: 0 10*3/uL (ref 0.0–0.7)
Eosinophils Relative: 0 %
HEMATOCRIT: 45.5 % (ref 36.0–46.0)
Hemoglobin: 16 g/dL — ABNORMAL HIGH (ref 12.0–15.0)
LYMPHS ABS: 1.2 10*3/uL (ref 0.7–4.0)
LYMPHS PCT: 25 %
MCH: 33.1 pg (ref 26.0–34.0)
MCHC: 35.2 g/dL (ref 30.0–36.0)
MCV: 94.2 fL (ref 78.0–100.0)
Monocytes Absolute: 0.4 10*3/uL (ref 0.1–1.0)
Monocytes Relative: 7 %
Neutro Abs: 3.4 10*3/uL (ref 1.7–7.7)
Neutrophils Relative %: 68 %
PLATELETS: 182 10*3/uL (ref 150–400)
RBC: 4.83 MIL/uL (ref 3.87–5.11)
RDW: 14.3 % (ref 11.5–15.5)
WBC: 5 10*3/uL (ref 4.0–10.5)

## 2017-11-17 LAB — COMPREHENSIVE METABOLIC PANEL
ALK PHOS: 87 U/L (ref 38–126)
ALT: 9 U/L (ref 0–44)
AST: 12 U/L — ABNORMAL LOW (ref 15–41)
Albumin: 4.6 g/dL (ref 3.5–5.0)
Anion gap: 9 (ref 5–15)
BILIRUBIN TOTAL: 0.3 mg/dL (ref 0.3–1.2)
BUN: <5 mg/dL — ABNORMAL LOW (ref 6–20)
CALCIUM: 9.2 mg/dL (ref 8.9–10.3)
CO2: 24 mmol/L (ref 22–32)
Chloride: 107 mmol/L (ref 98–111)
Creatinine, Ser: 0.77 mg/dL (ref 0.44–1.00)
Glucose, Bld: 82 mg/dL (ref 70–99)
Potassium: 3.8 mmol/L (ref 3.5–5.1)
Sodium: 140 mmol/L (ref 135–145)
TOTAL PROTEIN: 7.5 g/dL (ref 6.5–8.1)

## 2017-11-17 LAB — SALICYLATE LEVEL

## 2017-11-17 LAB — I-STAT BETA HCG BLOOD, ED (MC, WL, AP ONLY): I-stat hCG, quantitative: <5 m[IU]/mL (ref <5)

## 2017-11-17 LAB — ETHANOL

## 2017-11-17 NOTE — ED Provider Notes (Signed)
[facility]-EMERGENCY DEPT Provider Note   CSN: [id number] Arrival date & time: 11/17/17  1350     History   Chief Complaint Chief Complaint  Patient presents with  . Depression    HPI Jaclyn Chavez is a 47 y.o. female here for evaluation of worsening difficulty coping with her son's recent death.  Patient states patient her son died 18 months ago from a drug overdose, unexpectedly.  Since she has been trying to deal with everything on her own, she has joint support groups but feels like her depression is only worsening.  She feels like she is in a deep hole and it is getting harder to come out.  Patient states that it "hurts too much".  States she does not want to die or hurt herself but she does not want to live with this pain anymore.  She has gone to [place] but states that she was told to come here.  She denies any suicidal thoughts, suicidal attempt, homicidal ideations, auditory or visual hallucinations.  No EtOH or illicit drug use.  States that she has been very worried about her younger teenage daughter, feels like something is going to happen to her to.  No previous history of psych illnesses or psych medications.  HPI  History reviewed. No pertinent past medical history.  There are no active problems to display for this patient.   Past Surgical History:  Procedure Laterality Date  . ABLATION       OB History   None      Home Medications    Prior to Admission medications   Medication Sig Start Date End Date Taking? Authorizing Provider  acetaminophen (TYLENOL) 500 MG tablet Take 1,000 mg by mouth daily as needed for mild pain.   Yes [provider]  meloxicam (MOBIC) 7.5 MG tablet Take 1-2 tablets (7.5-15 mg total) by mouth daily. Take 2 tabs for 5 days, then 1-2 tabs daily as needed for pain Patient not taking: Reported on 11/17/2017 12/04/15   Lurene Shadow, PA-C  predniSONE (DELTASONE) 20 MG tablet 3 tabs po day one, then 2 po  daily x 4 days Patient not taking: Reported on 11/17/2017 12/04/15   Lurene Shadow, PA-C  predniSONE (DELTASONE) 20 MG tablet 3 tabs po day one, then 2 po daily x 4 days Patient not taking: Reported on 11/17/2017 05/16/16   Lurene Shadow, PA-C  traMADol (ULTRAM) 50 MG tablet Take 1 tablet (50 mg total) by mouth every 6 (six) hours as needed. Patient not taking: Reported on 11/17/2017 12/04/15   Rolla Plate    Family History No family history on file.  Social History Social History   Tobacco Use  . Smoking status: Current Every Day Smoker    Packs/day: 0.50    Types: Cigarettes  . Smokeless tobacco: Never Used  Substance Use Topics  . Alcohol use: Yes  . Drug use: Not on file     Allergies   Patient has no known allergies.   Review of Systems Review of Systems  Psychiatric/Behavioral: Positive for dysphoric mood and sleep disturbance. The patient is nervous/anxious.   All other systems reviewed and are negative.    Physical Exam Updated Vital Signs BP 118/80 (BP Location: Left Arm)   Pulse 99   Temp 98.4 F (36.9 C) (Oral)   Resp 20   SpO2 99%   Physical Exam  Constitutional: She is oriented to person, place, and time. She appears well-developed  and well-nourished. No distress.  NAD.  HENT:  Head: Normocephalic and atraumatic.  Right Ear: External ear normal.  Left Ear: External ear normal.  Nose: Nose normal.  Eyes: Conjunctivae and EOM are normal.  Neck: Normal range of motion. Neck supple.  Cardiovascular: Normal rate, regular rhythm and normal heart sounds.  No murmur heard. Pulmonary/Chest: Effort normal and breath sounds normal.  Musculoskeletal: Normal range of motion.  Neurological: She is alert and oriented to person, place, and time.  Skin: Skin is warm and dry. Capillary refill takes less than 2 seconds.  Psychiatric: Her mood appears anxious. She is withdrawn.  Teary, crying.  Poor eye contact. Cooperative. No active SI, HI, AVH.     Nursing note and vitals reviewed.    ED Treatments / Results  Labs (all labs ordered are listed, but only abnormal results are displayed) Labs Reviewed  CBC WITH DIFFERENTIAL/PLATELET - Abnormal; Notable for the following components:      Result Value   Hemoglobin 16.0 (*)    All other components within normal limits  COMPREHENSIVE METABOLIC PANEL - Abnormal; Notable for the following components:   BUN <5 (*)    AST 12 (*)    All other components within normal limits  ETHANOL  SALICYLATE LEVEL  I-STAT BETA HCG BLOOD, ED ([facility], [facility], [facility] ONLY)    EKG None  Radiology No results found.  Procedures Procedures (including critical care time)  Medications Ordered in ED Medications - No data to display   Initial Impression / Assessment and Plan / ED Course  I have reviewed the triage vital signs and the nursing notes.  Pertinent labs & imaging results that were available during my care of the patient were reviewed by me and considered in my medical decision making (see chart for details).    47 year old female here with brief reaction, worsening depression, beginning to think that "she cannot do it anymore".  Recent known stressors.  It appears she has tried to reach out to outpatient resources, [facility], previous urgent care facilities for help.  She has been unable to establish care with psychiatric resources.  She is denying active suicidal, homicidal or AVH today however given significant stressor, worsening symptoms, there is concern that patient may have psychiatric decompensation soon.  Given this, will request TTS consult for their formal evaluation and recommendations.  No previous history of suicide attempt or psych illness or psych meds.  Pending medical clearance lab work.  1620: Lab work reviewed and unremarkable. Med cleared. Awaiting TTS.   1945: TTS recommends psych eval in AM. Final Clinical Impressions(s) / ED Diagnoses   Final diagnoses:  Grief reaction    ED  Discharge Orders    None       Liberty HandyGibbons, Vivion Romano J, PA-C 11/17/17 1947    Benjiman CorePickering, Nathan, MD 11/18/17 [phone]

## 2017-11-17 NOTE — ED Notes (Signed)
Bed: [facility] Expected date:  Expected time:  Means of arrival:  Comments: 

## 2017-11-17 NOTE — ED Notes (Signed)
Pt to be dc'd in AM for OP treatment with Dr Cory MunchIzzie for follow up per NP.

## 2017-11-17 NOTE — BH Assessment (Signed)
Tele Assessment Note   Patient Name: Jaclyn Chavez MRN: [id number] Referring Physician: PA Debarah Crape Location of Patient: [facility] ED Location of Provider: Behavioral Health TTS Department  Jaclyn Chavez is an 47 y.o. female.  The pt came in after being referred by [facility] .  The pt reported she came in due to to feeling like she doesn't want to be in this world any more and feels that she doesn't have a purpose.  The pt denies SI. She stated she doesn't want to hurt herself.  The pt denies any previous suicide attempts in the past.  She stated her son overdosed 18 months ago and she found out that her boyfriend is currently doing drugs.  She has irrational thoughts that something bad will happen to her 31 year old daughter, such as get shot at school or get hit by a car.  The pt is not seeing a counselor currently.  She has gone to [facility] in the past, but didn't like the counselor.  The pt denies any other mental health treatment and denies inpatient treatment in the past.  The pt lives with her boyfriend.  She stated her 36 year old daughter comes to her home occasionally, but she doesn't want her daughter around her boyfriend.  The pt currently denies SI and denies any previous attempts.  She stated her mother has made suicide attempts in the past.  The pt denies self harm, HI, legal issues, history of abuse and hallucinations.  The pt reported she is sleeping about 4-5 hours a night and she has a fair appetite.  The pt reported she feels hopeless, has little interest in pleasurable things, feels bad about herself, has problems concentrating and has crying spells.  The pt denies SA.  Pt is dressed in scrubs. She is alert and oriented x4. Pt speaks in a clear tone, at moderate volume and normal pace. Eye contact is good. Pt's mood is depressed. Thought process is coherent and relevant. There is no indication Pt is currently responding to internal stimuli or experiencing delusional thought content.?Pt was  cooperative throughout assessment.     Diagnosis: F33.2 Major depressive disorder, Recurrent episode, Severe   Past Medical History: History reviewed. No pertinent past medical history.  Past Surgical History:  Procedure Laterality Date  . ABLATION      Family History: No family history on file.  Social History:  reports that she has been smoking cigarettes. She has been smoking about 0.50 packs per day. She has never used smokeless tobacco. She reports that she drinks alcohol. Her drug history is not on file.  Additional Social History:  Alcohol / Drug Use Pain Medications: See MAR Prescriptions: See MAR Over the Counter: See MAR History of alcohol / drug use?: No history of alcohol / drug abuse Longest period of sobriety (when/how long): NA  CIWA: CIWA-Ar BP: 118/80 Pulse Rate: 99 COWS:    Allergies: No Known Allergies  Home Medications:  (Not in a hospital admission)  OB/GYN Status:  No LMP recorded. Patient has had an ablation.  General Assessment Data Location of Assessment: [facility] ED TTS Assessment: In system Is this a Tele or Face-to-Face Assessment?: Tele Assessment Is this an Initial Assessment or a Re-assessment for this encounter?: Initial Assessment Marital status: Long term relationship Is patient pregnant?: No Pregnancy Status: No Living Arrangements: Spouse/significant other, Children Can pt return to current living arrangement?: Yes Admission Status: Voluntary Is patient capable of signing voluntary admission?: Yes Referral Source: Self/Family/Friend Insurance  type: Medicaid     Crisis Care Plan Living Arrangements: Spouse/significant other, Children Legal Guardian: Other:(Self) Name of Psychiatrist: none Name of Therapist: none  Education Status Is patient currently in school?: No Is the patient employed, unemployed or receiving disability?: Unemployed  Risk to self with the past 6 months Suicidal Ideation: No Has patient been a risk to  self within the past 6 months prior to admission? : No Suicidal Intent: No Has patient had any suicidal intent within the past 6 months prior to admission? : No Is patient at risk for suicide?: No Suicidal Plan?: No Has patient had any suicidal plan within the past 6 months prior to admission? : No Access to Means: No What has been your use of drugs/alcohol within the last 12 months?: none Previous Attempts/Gestures: No How many times?: 0 Other Self Harm Risks: none Triggers for Past Attempts: None known Intentional Self Injurious Behavior: None Family Suicide History: Yes Recent stressful life event(s): Loss (Comment)(son died a year and a half ago) Persecutory voices/beliefs?: No Depression: Yes Depression Symptoms: Despondent, Insomnia, Tearfulness, Isolating, Guilt, Loss of interest in usual pleasures, Feeling worthless/self pity Substance abuse history and/or treatment for substance abuse?: No Suicide prevention information given to non-admitted patients: Yes  Risk to Others within the past 6 months Homicidal Ideation: No Does patient have any lifetime risk of violence toward others beyond the six months prior to admission? : No Thoughts of Harm to Others: No Current Homicidal Intent: No Current Homicidal Plan: No Access to Homicidal Means: No Identified Victim: none History of harm to others?: No Assessment of Violence: None Noted Violent Behavior Description: none Does patient have access to weapons?: No Criminal Charges Pending?: No Does patient have a court date: No Is patient on probation?: No  Psychosis Hallucinations: None noted Delusions: None noted  Mental Status Report Appearance/Hygiene: In scrubs, Unremarkable Eye Contact: Good Motor Activity: Unremarkable Speech: Logical/coherent Level of Consciousness: Alert Mood: Depressed Affect: Depressed Anxiety Level: None Thought Processes: Coherent, Relevant Judgement: Impaired Orientation: Person, Place,  Time, Situation Obsessive Compulsive Thoughts/Behaviors: Moderate  Cognitive Functioning Concentration: Decreased Memory: Recent Intact, Remote Intact Is patient IDD: No Is patient DD?: No Insight: Fair Impulse Control: Fair Appetite: Fair Have you had any weight changes? : No Change Sleep: Decreased Total Hours of Sleep: 3 Vegetative Symptoms: None  ADLScreening [facility] Assessment Services) Patient's cognitive ability adequate to safely complete daily activities?: Yes Patient able to express need for assistance with ADLs?: Yes Independently performs ADLs?: Yes (appropriate for developmental age)  Prior Inpatient Therapy Prior Inpatient Therapy: No  Prior Outpatient Therapy Prior Outpatient Therapy: No Does patient have an ACCT team?: No Does patient have Intensive In-House Services?  : No Does patient have [facility] services? : No Does patient have P4CC services?: No  ADL Screening (condition at time of admission) Patient's cognitive ability adequate to safely complete daily activities?: Yes Patient able to express need for assistance with ADLs?: Yes Independently performs ADLs?: Yes (appropriate for developmental age)       Abuse/Neglect Assessment (Assessment to be complete while patient is alone) Abuse/Neglect Assessment Can Be Completed: Yes Physical Abuse: Denies Verbal Abuse: Denies Sexual Abuse: Denies Exploitation of patient/patient's resources: Denies Self-Neglect: Denies Values / Beliefs Cultural Requests During Hospitalization: None Spiritual Requests During Hospitalization: None Consults Spiritual Care Consult Needed: No Social Work Consult Needed: No Merchant navy officer (For Healthcare) Does Patient Have a Medical Advance Directive?: No          Disposition:  Disposition Initial  Assessment Completed for this Encounter: Yes  NP Reola Calkinsravis Money recommends the pt be observed overnight for safety and stabilization.  The pt is to be reassessed by  psychiatry in the morning.   This service was provided via telemedicine using a 2-way, interactive audio and video technology.  Names of all persons participating in this telemedicine service and their role in this encounter. Name: Marylee FlorasDeanna Weatherford Role: Pt  Name: Riley ChurchesKendall Nature Kueker Role: TTS  Name:  Role:   Name:  Role:     Ottis StainGarvin, Roniyah Llorens Jermaine 11/17/2017 7:38 PM

## 2017-11-17 NOTE — ED Notes (Signed)
Bed: [facility]37 Expected date:  Expected time:  Means of arrival:  Comments: Triage 3  

## 2017-11-17 NOTE — ED Notes (Signed)
PT reports that she has been depressed since her son died in Jan.  Pt reports that she did go to some grief counseling thru hospice, but it did not help much.  Pt reports that she "does not want to die.. Get so overwhelmed I don't want to go on..."  Pt denies current si/hi/avh, nad, procedures explained.

## 2017-11-17 NOTE — ED Notes (Signed)
Eating supper, nad. 

## 2017-11-17 NOTE — ED Triage Notes (Signed)
Pt reports that her son died 18 months ago and she cant cope with the pain. Reports that she doesn't want to live when the pain/hurt gets so bad but doesn't want to harm herself.

## 2017-11-17 NOTE — BH Assessment (Deleted)
Tele Assessment Note   Patient Name: Jaclyn Chavez MRN: [id number] Referring Physician: PA Debarah Crapelaudia [facility] Location of Provider: Behavioral Health TTS Department  Lili Baruch MerlM Yassin is an 47 y.o. female.   Diagnosis: F33.2 Major depressive disorder, Recurrent episode, Severe   Past Medical History: History reviewed. No pertinent past medical history.  Past Surgical History:  Procedure Laterality Date  . ABLATION      Family History: No family history on file.  Social History:  reports that she has been smoking cigarettes. She has been smoking about 0.50 packs per day. She has never used smokeless tobacco. She reports that she drinks alcohol. Her drug history is not on file.  Additional Social History:  Alcohol / Drug Use Pain Medications: See MAR Prescriptions: See MAR Over the Counter: See MAR History of alcohol / drug use?: No history of alcohol / drug abuse Longest period of sobriety (when/how long): NA  CIWA: CIWA-Ar BP: 95/60 Pulse Rate: (!) 113 COWS:    Allergies: No Known Allergies  Home Medications:  (Not in a hospital admission)  OB/GYN Status:  No LMP recorded. Patient has had an ablation.  General Assessment Data Location of Assessment: [facility] ED TTS Assessment: In system Is this a Tele or Face-to-Face Assessment?: Tele Assessment Is this an Initial Assessment or a Re-assessment for this encounter?: Initial Assessment Marital status: Long term relationship Is patient pregnant?: No Pregnancy Status: No Living Arrangements: Spouse/significant other, Children Can pt return to current living arrangement?: Yes Admission Status: Voluntary Is patient capable of signing voluntary admission?: Yes Referral Source: Self/Family/Friend Insurance type: Medicaid     Crisis Care Plan Living Arrangements: Spouse/significant other, Children Legal Guardian: Other:(Self) Name of Psychiatrist: none Name of Therapist: none  Education Status Is  patient currently in school?: No Is the patient employed, unemployed or receiving disability?: Unemployed  Risk to self with the past 6 months Suicidal Ideation: No Has patient been a risk to self within the past 6 months prior to admission? : No Suicidal Intent: No Has patient had any suicidal intent within the past 6 months prior to admission? : No Is patient at risk for suicide?: No Suicidal Plan?: No Has patient had any suicidal plan within the past 6 months prior to admission? : No Access to Means: No What has been your use of drugs/alcohol within the last 12 months?: none Previous Attempts/Gestures: No How many times?: 0 Other Self Harm Risks: none Triggers for Past Attempts: None known Intentional Self Injurious Behavior: None Family Suicide History: Yes Recent stressful life event(s): Loss (Comment)(son died a year and a half ago) Persecutory voices/beliefs?: No Depression: Yes Depression Symptoms: Despondent, Insomnia, Tearfulness, Isolating, Guilt, Loss of interest in usual pleasures, Feeling worthless/self pity Substance abuse history and/or treatment for substance abuse?: No Suicide prevention information given to non-admitted patients: Yes  Risk to Others within the past 6 months Homicidal Ideation: No Does patient have any lifetime risk of violence toward others beyond the six months prior to admission? : No Thoughts of Harm to Others: No Current Homicidal Intent: No Current Homicidal Plan: No Access to Homicidal Means: No Identified Victim: none History of harm to others?: No Assessment of Violence: None Noted Violent Behavior Description: none Does patient have access to weapons?: No Criminal Charges Pending?: No Does patient have a court date: No Is patient on probation?: No  Psychosis Hallucinations: None noted Delusions: None noted  Mental Status Report Appearance/Hygiene: In scrubs, Unremarkable Eye Contact: Good Motor Activity:  Unable to  assess Speech: Logical/coherent Level of Consciousness: Alert Mood: Depressed Affect: Depressed Anxiety Level: None Thought Processes: Coherent, Relevant Judgement: Impaired Orientation: Person, Place, Time, Situation Obsessive Compulsive Thoughts/Behaviors: Moderate  Cognitive Functioning Concentration: Decreased Memory: Recent Intact, Remote Intact Is patient IDD: No Is patient DD?: No Insight: Fair Impulse Control: Fair Appetite: Fair Have you had any weight changes? : No Change Sleep: Decreased Total Hours of Sleep: 3 Vegetative Symptoms: None  ADLScreening [facility] Assessment Services) Patient's cognitive ability adequate to safely complete daily activities?: Yes Patient able to express need for assistance with ADLs?: Yes Independently performs ADLs?: Yes (appropriate for developmental age)  Prior Inpatient Therapy Prior Inpatient Therapy: No  Prior Outpatient Therapy Prior Outpatient Therapy: No Does patient have an ACCT team?: No Does patient have Intensive In-House Services?  : No Does patient have [facility] services? : No Does patient have P4CC services?: No  ADL Screening (condition at time of admission) Patient's cognitive ability adequate to safely complete daily activities?: Yes Patient able to express need for assistance with ADLs?: Yes Independently performs ADLs?: Yes (appropriate for developmental age)       Abuse/Neglect Assessment (Assessment to be complete while patient is alone) Abuse/Neglect Assessment Can Be Completed: Yes Physical Abuse: Denies Verbal Abuse: Denies Sexual Abuse: Denies Exploitation of patient/patient's resources: Denies Self-Neglect: Denies Values / Beliefs Cultural Requests During Hospitalization: None Spiritual Requests During Hospitalization: None Consults Spiritual Care Consult Needed: No Social Work Consult Needed: No Merchant navy officer (For Healthcare) Does Patient Have a Medical Advance Directive?: No           Disposition:  Disposition Initial Assessment Completed for this Encounter: Yes  This service was provided via telemedicine using a 2-way, interactive audio and Immunologist.  Names of all persons participating in this telemedicine service and their role in this encounter. Name: Vida Nicol Role: Pt  Name: Riley Churches Role: TTS  Name:  Role:   Name:  Role:     Ottis Stain 11/17/2017 6:52 PM

## 2017-11-18 DIAGNOSIS — F329 Major depressive disorder, single episode, unspecified: Secondary | ICD-10-CM

## 2017-11-18 DIAGNOSIS — Z79899 Other long term (current) drug therapy: Secondary | ICD-10-CM

## 2017-11-18 DIAGNOSIS — F32A Depression, unspecified: Secondary | ICD-10-CM | POA: Diagnosis present

## 2017-11-18 DIAGNOSIS — F419 Anxiety disorder, unspecified: Secondary | ICD-10-CM

## 2017-11-18 DIAGNOSIS — Z634 Disappearance and death of family member: Secondary | ICD-10-CM

## 2017-11-18 MED ORDER — FLUOXETINE HCL 20 MG PO CAPS: 20.0000 mg | ORAL_CAPSULE | Freq: Every day | ORAL | 0 refills | Status: AC

## 2017-11-18 MED ORDER — HYDROXYZINE HCL 25 MG PO TABS: 25.0000 mg | ORAL_TABLET | Freq: Two times a day (BID) | ORAL | 0 refills | Status: AC

## 2017-11-18 MED ORDER — HYDROXYZINE HCL 25 MG PO TABS
25.0000 mg | ORAL_TABLET | Freq: Two times a day (BID) | ORAL | Status: DC
  Filled 2017-11-18: qty 1

## 2017-11-18 MED ORDER — FLUOXETINE HCL 20 MG PO CAPS
20.0000 mg | ORAL_CAPSULE | Freq: Every day | ORAL | Status: DC
  Administered 2017-11-18: 20 mg via ORAL
  Filled 2017-11-18: qty 1

## 2017-11-18 NOTE — Discharge Instructions (Signed)
For your behavioral health needs, you are advised to follow up with an outpatient provider in your community.  Contact [facility] at your earliest opportunity to arrange for these services.  Please note that the phone number listed below also serves as a 24 hour crisis number:       [facility]      [phone]

## 2017-11-18 NOTE — BH Assessment (Signed)
BHH Assessment Progress Note  Per Leata MouseJanardhana Jonnalagadda, MD, this pt does not require psychiatric hospitalization at this time.  [facility] with recommendation to contact [facility] to find an outpatient provider in her community.  This has been included in pt's discharge instructions.  Pt's nurse, Diane, has been notified.  Doylene Canninghomas Baani Bober, MA Triage Specialist [phone]

## 2017-11-18 NOTE — Consult Note (Addendum)
[facility] Psych ED Discharge  11/18/2017 12:22 PM Jaclyn Chavez  MRN:  [id number] Principal Problem: Depression Discharge Diagnoses:  Patient Active Problem List   Diagnosis Date Noted  . Depression [F32.9] 11/18/2017    Subjective: Pt was seen and chart reviewed with treatment team and Dr Elsie Saas.  Pt denies suicidal/homicidal ideation, denies auditory/visual hallucinations and does not appear to be responding to internal stimuli. Pt stated she came to the ED for help with her grieving process about her son's sudden death from a drug overdose 18 months ago. Pt stated she did go to a therapist and psychiatrist briefly but has not been I quite a while. Pt stated she has never been on medication for depression and just wants to get some help. Pt's UDS and Bal were negative. Pt stated she does not want to be hospitalized and just wants to get on medication and go to an outpatient provider. Pt's lab work was unremarkable. Pt was started on Prozac 20 mg daily for depression and Vistaril 25 mg twice daily for anxiety and given resources to go to [employer] to get outpatient counseling resources for the [place] area. Pt is stable and psychiatrically clear for discharge.  Total Time spent with patient: 45 minutes  Past Psychiatric History: As above  Past Medical History: History reviewed. No pertinent past medical history. Past Surgical History:  Procedure Laterality Date  . ABLATION     Family History: No family history on file. Family Psychiatric  History: Unknown Social History:  Social History   Substance and Sexual Activity  Alcohol Use Yes    Social History   Substance and Sexual Activity  Drug Use Not on file   Social History   Socioeconomic History  . Marital status: Divorced    Spouse name: Not on file  . Number of children: Not on file  . Years of education: Not on file  . Highest education level: Not on file  Occupational History  . Not on file  Social  Needs  . Financial resource strain: Not on file  . Food insecurity:    Worry: Not on file    Inability: Not on file  . Transportation needs:    Medical: Not on file    Non-medical: Not on file  Tobacco Use  . Smoking status: Current Every Day Smoker    Packs/day: 0.50    Types: Cigarettes  . Smokeless tobacco: Never Used  Substance and Sexual Activity  . Alcohol use: Yes  . Drug use: Not on file  . Sexual activity: Not on file  Lifestyle  . Physical activity:    Days per week: Not on file    Minutes per session: Not on file  . Stress: Not on file  Relationships  . Social connections:    Talks on phone: Not on file    Gets together: Not on file    Attends religious service: Not on file    Active member of club or organization: Not on file    Attends meetings of clubs or organizations: Not on file    Relationship status: Not on file  Other Topics Concern  . Not on file  Social History Narrative  . Not on file    Has this patient used any form of tobacco in the last 30 days? (Cigarettes, Smokeless Tobacco, Cigars, and/or Pipes) Prescription not provided because: Pt declined  Current Medications: Current Facility-Administered Medications  Medication Dose Route Frequency Provider Last Rate Last Dose  . FLUoxetine (  PROZAC) capsule 20 mg  20 mg Oral Daily Laveda AbbeParks, Laurie Britton, NP      . hydrOXYzine (ATARAX/VISTARIL) tablet 25 mg  25 mg Oral BID Laveda AbbeParks, Laurie Britton, NP       Current Outpatient Medications  Medication Sig Dispense Refill  . acetaminophen (TYLENOL) 500 MG tablet Take 1,000 mg by mouth daily as needed for mild pain.    Marland Kitchen. FLUoxetine (PROZAC) 20 MG capsule Take 1 capsule (20 mg total) by mouth daily. 30 capsule 0  . hydrOXYzine (ATARAX/VISTARIL) 25 MG tablet Take 1 tablet (25 mg total) by mouth 2 (two) times daily. 30 tablet 0  . meloxicam (MOBIC) 7.5 MG tablet Take 1-2 tablets (7.5-15 mg total) by mouth daily. Take 2 tabs for 5 days, then 1-2 tabs daily as  needed for pain (Patient not taking: Reported on 11/17/2017) 30 tablet 0  . predniSONE (DELTASONE) 20 MG tablet 3 tabs po day one, then 2 po daily x 4 days (Patient not taking: Reported on 11/17/2017) 11 tablet 0  . predniSONE (DELTASONE) 20 MG tablet 3 tabs po day one, then 2 po daily x 4 days (Patient not taking: Reported on 11/17/2017) 11 tablet 0  . traMADol (ULTRAM) 50 MG tablet Take 1 tablet (50 mg total) by mouth every 6 (six) hours as needed. (Patient not taking: Reported on 11/17/2017) 15 tablet 0    Musculoskeletal: Strength & Muscle Tone: within normal limits Gait & Station: normal Patient leans: N/A  Psychiatric Specialty Exam: Physical Exam  Constitutional: She is oriented to person, place, and time. She appears well-developed and well-nourished.  HENT:  Head: Normocephalic.  Respiratory: Effort normal.  Musculoskeletal: Normal range of motion.  Neurological: She is alert and oriented to person, place, and time.  Psychiatric: Her speech is normal and behavior is normal. Judgment and thought content normal. Cognition and memory are normal. She exhibits a depressed mood.    Review of Systems  Psychiatric/Behavioral: Positive for depression. Negative for hallucinations, memory loss, substance abuse and suicidal ideas. The patient is not nervous/anxious and does not have insomnia.   All other systems reviewed and are negative.   Blood pressure 110/65, pulse 85, temperature 98.3 F (36.8 C), temperature source Oral, resp. rate 16, SpO2 98 %.There is no height or weight on file to calculate BMI.  General Appearance: Casual  Eye Contact:  Good  Speech:  Clear and Coherent and Normal Rate  Volume:  Normal  Mood:  Depressed  Affect:  Congruent, Depressed and Tearful  Thought Process:  Coherent, Goal Directed and Linear  Orientation:  Full (Time, Place, and Person)  Thought Content:  Logical  Suicidal Thoughts:  No  Homicidal Thoughts:  No  Memory:  Immediate;   Good Recent;    Good Remote;   Fair  Judgement:  Fair  Insight:  Fair  Psychomotor Activity:  Normal  Concentration:  Concentration: Good and Attention Span: Good  Recall:  Good  Fund of Knowledge:  Good  Language:  Good  Akathisia:  No  Handed:  Right  AIMS (if indicated):     Assets:  ArchitectCommunication Skills Financial Resources/Insurance Housing Physical Health Social Support  ADL's:  Intact  Cognition:  WNL  Sleep:   Fair    Demographic Factors:  Caucasian, Low socioeconomic status and Unemployed  Loss Factors: Financial problems/change in socioeconomic status  Historical Factors: Impulsivity  Risk Reduction Factors:   Sense of responsibility to family and Living with another person, especially a relative  Continued Clinical Symptoms:  Depression:   Hopelessness Impulsivity  Cognitive Features That Contribute To Risk:  Closed-mindedness    Suicide Risk:  Minimal: No identifiable suicidal ideation.  Patients presenting with no risk factors but with morbid ruminations; may be classified as minimal risk based on the severity of the depressive symptoms   Plan Of Care/Follow-up recommendations:  Activity:  as tolerated Diet:  Heart healthy  Disposition: Take all medications as prescribed. -Prozac 20 mg daily for depression -Vistaril 25 mg twice daily for anxiety Follow up at [facility] for assistance to find counseling in the [place] area.  Keep all follow-up appointments as scheduled.  Do not consume alcohol or use illegal drugs while on prescription medications. Report any adverse effects from your medications to your primary care provider promptly.  In the event of recurrent symptoms or worsening symptoms, call 911, a crisis hotline, or go to the nearest emergency department for evaluation.   Laveda Abbe, NP 11/18/2017, 12:22 PM   Patient seen face to face for this evaluation, case discussed with treatment team and physician extender and formulated  treatment plan. Reviewed the information documented and agree with the treatment plan.  Leata Mouse, MD 11/18/2017

## 2017-11-18 NOTE — ED Notes (Signed)
Pt discharged home. Discharged instructions read to pt who verbalized understanding. All belongings returned to pt who signed for same. Denies SI/HI, is not delusional and not responding to internal stimuli. Escorted pt to the ED exit.    

## 2018-05-28 IMAGING — US US EXTREM LOW VENOUS*L*
1 series · 13 of 24 positions shown · non-contrast
Comparison: None.

CLINICAL DATA: 45-year-old female with a history of varicose veins



[Series 1: us extrem low venous*left* · 0.06mm/px · 13 of 29 slices shown]
[im 1/29]
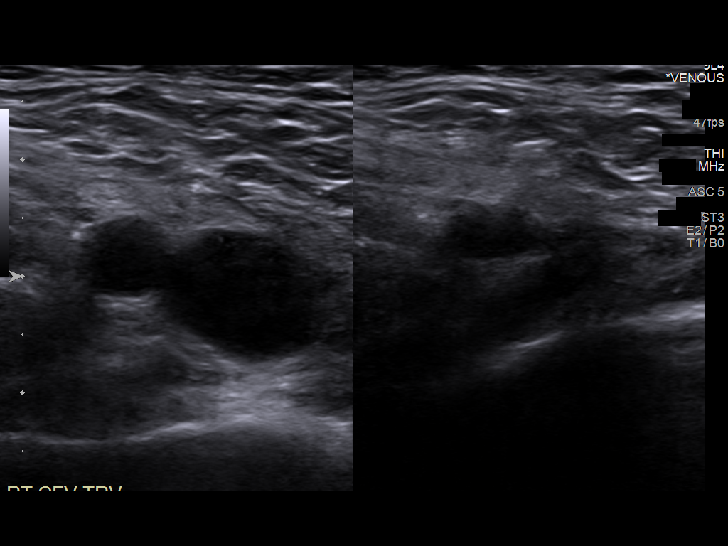
[im 3/29]
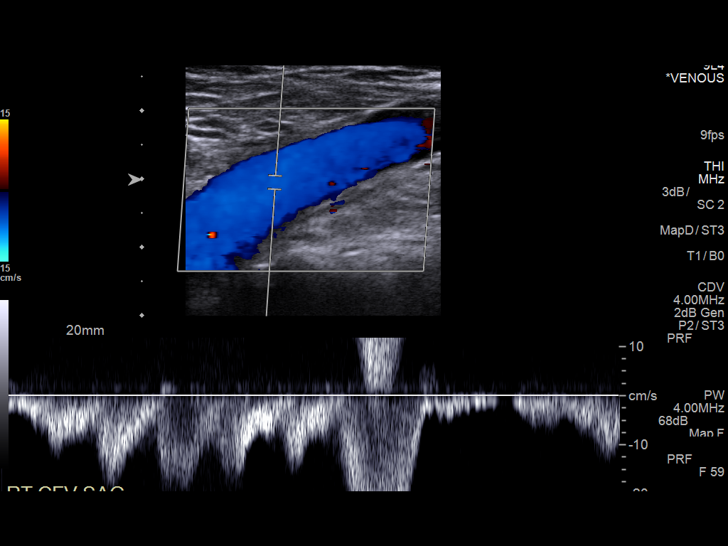
[im 5/29]
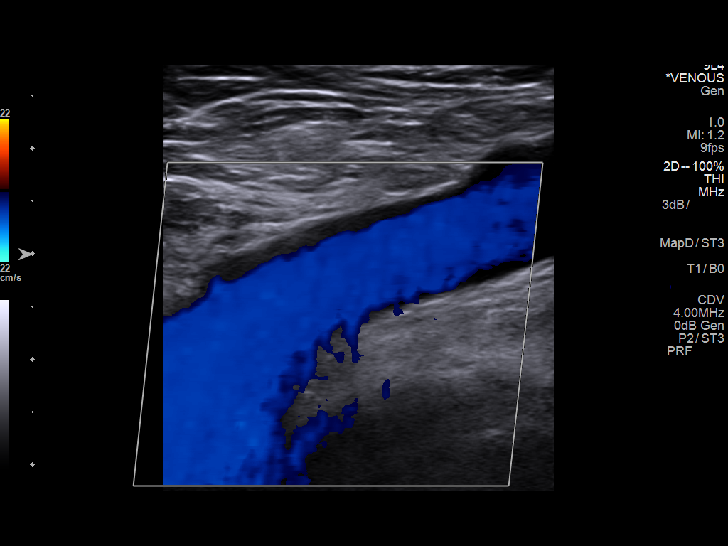
[im 8/29]
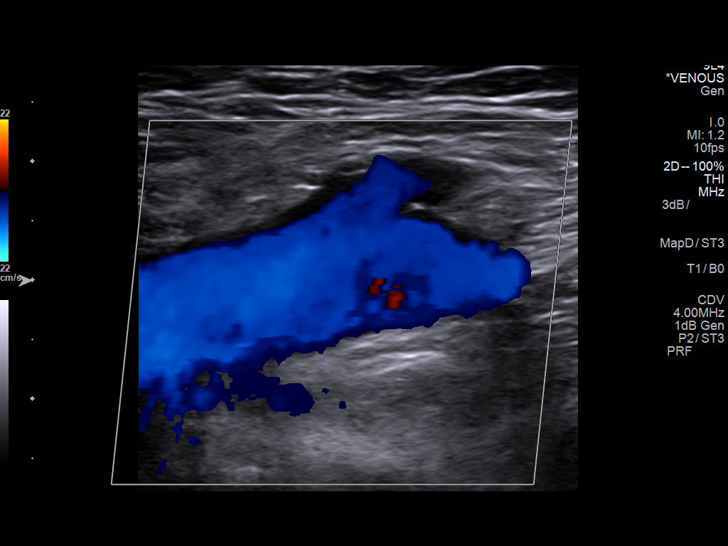
[im 10/29]
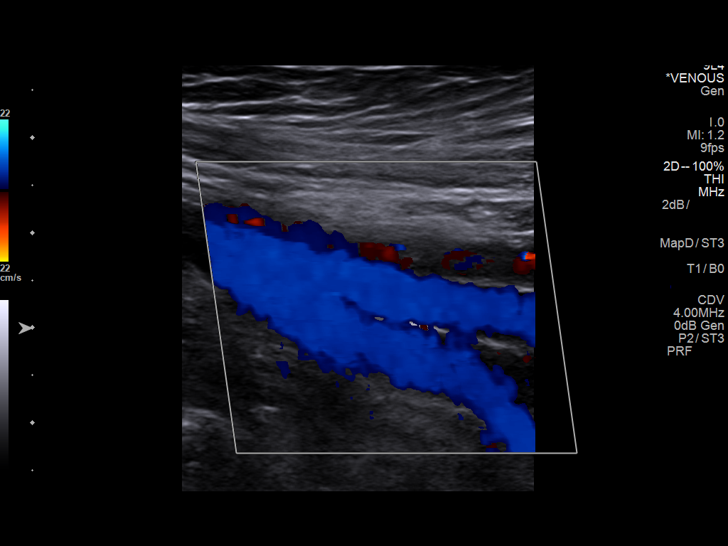
[im 13/29]
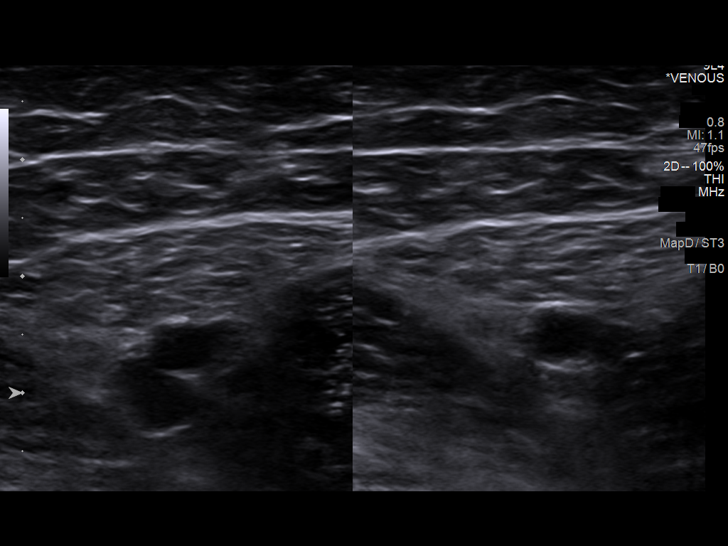
[im 15/29]
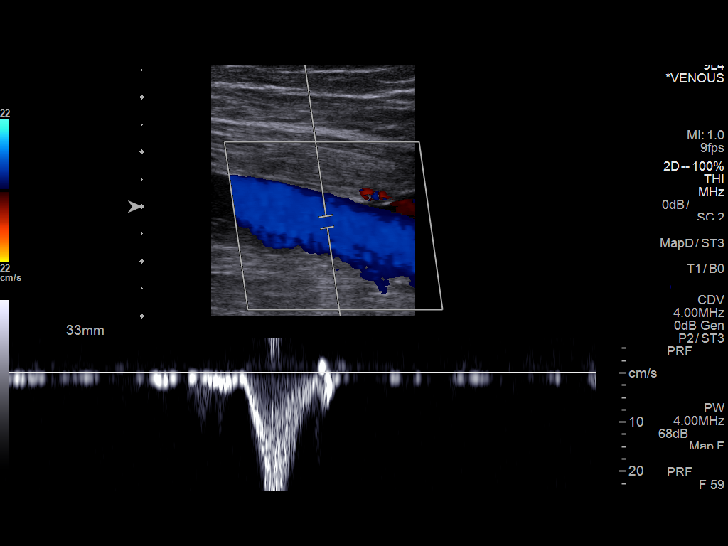
[im 16/29]
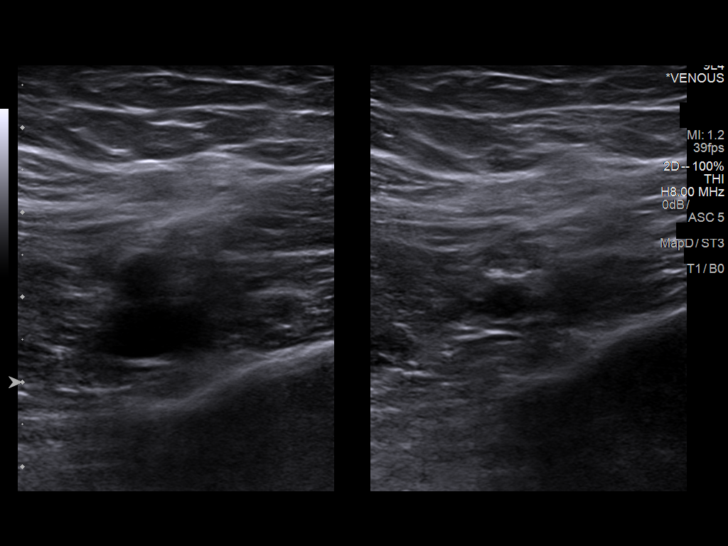
[im 19/29]
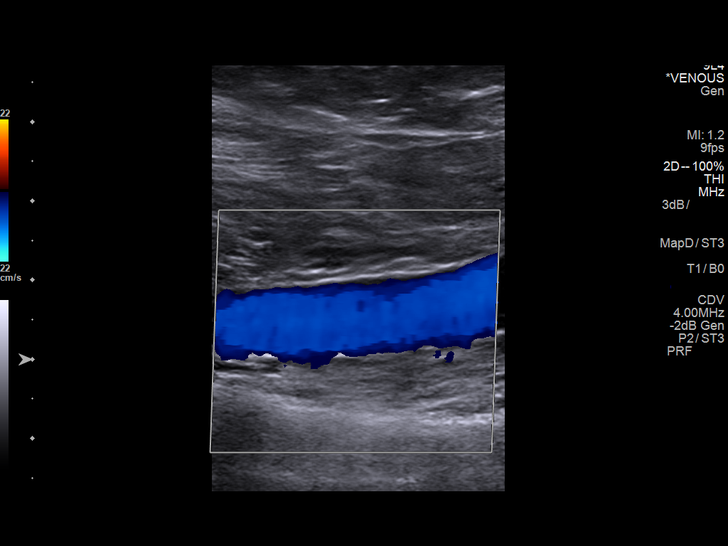
[im 21/29]
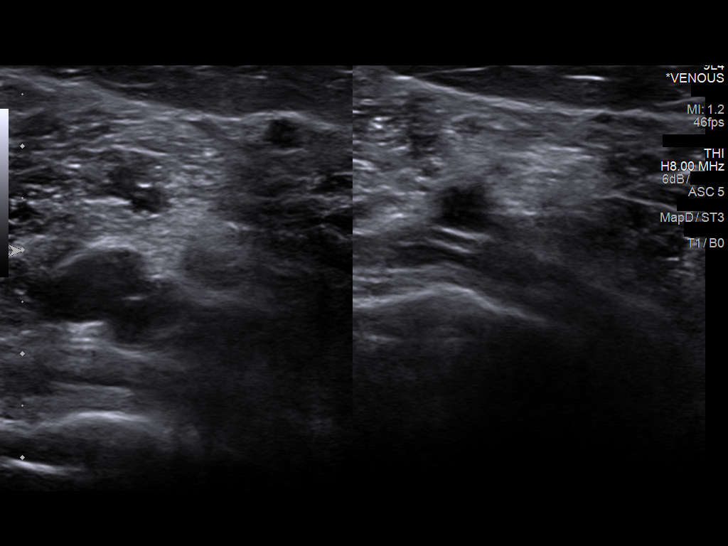
[im 24/29]
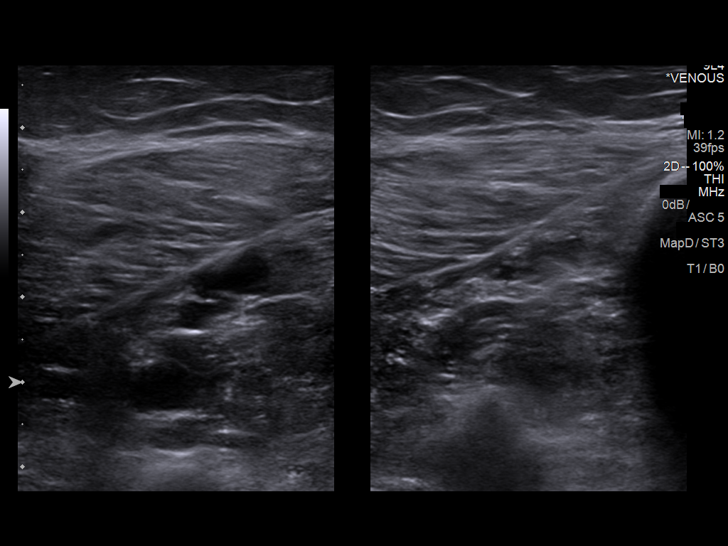
[im 26/29]
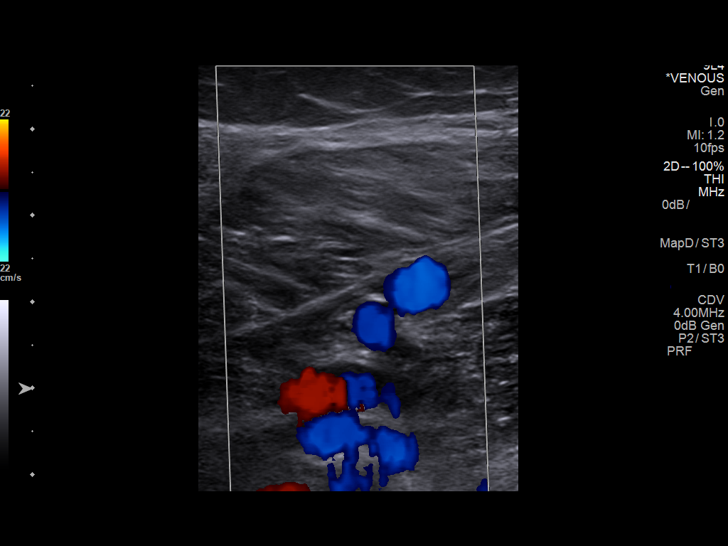
[im 29/29]
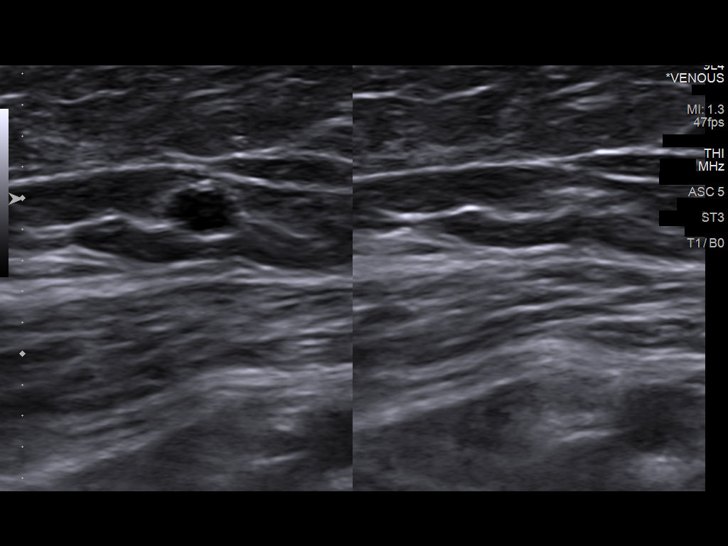

[13 of 24 positions shown; findings below may reference images not displayed]

FINDINGS: Contralateral Common Femoral Vein: Respiratory phasicity is normal
and symmetric with the symptomatic side. No evidence of thrombus.
Normal compressibility.

Common Femoral Vein: No evidence of thrombus. Normal
compressibility, respiratory phasicity and response to augmentation.

Saphenofemoral Junction: No evidence of thrombus. Normal
compressibility and flow on color Doppler imaging.

Profunda Femoral Vein: No evidence of thrombus. Normal
compressibility and flow on color Doppler imaging.

Femoral Vein: No evidence of thrombus. Normal compressibility,
respiratory phasicity and response to augmentation.

Popliteal Vein: No evidence of thrombus. Normal compressibility,
respiratory phasicity and response to augmentation.

Calf Veins: No evidence of thrombus. Normal compressibility and flow
on color Doppler imaging.

Superficial Great Saphenous Vein: No evidence of thrombus. Normal
compressibility and flow on color Doppler imaging.

Other Findings:  None.
IMPRESSION: Sonographic survey of the left lower extremity negative for DVT.

## 2019-09-15 ENCOUNTER — Encounter (HOSPITAL_COMMUNITY): Payer: Self-pay

## 2019-09-15 ENCOUNTER — Emergency Department (HOSPITAL_COMMUNITY)
Admission: EM | Admit: 2019-09-15 | Discharge: 2019-09-15 | Disposition: A | Discharge status: Home / Self Care | Payer: Medicaid Other | Attending: Emergency Medicine | Admitting: Emergency Medicine

## 2019-09-15 ENCOUNTER — Other Ambulatory Visit: Payer: Self-pay

## 2019-09-15 DIAGNOSIS — R1110 Vomiting, unspecified: Secondary | ICD-10-CM | POA: Diagnosis present

## 2019-09-15 LAB — CBC
HCT: 45.8 % (ref 36.0–46.0)
Hemoglobin: 16.4 g/dL — ABNORMAL HIGH (ref 12.0–15.0)
MCH: 30 pg (ref 26.0–34.0)
MCHC: 35.8 g/dL (ref 30.0–36.0)
MCV: 83.9 fL (ref 80.0–100.0)
Platelets: 210 10*3/uL (ref 150–400)
RBC: 5.46 MIL/uL — ABNORMAL HIGH (ref 3.87–5.11)
RDW: 12.7 % (ref 11.5–15.5)
WBC: 7.4 10*3/uL (ref 4.0–10.5)
nRBC: 0 % (ref 0.0–0.2)

## 2019-09-15 LAB — COMPREHENSIVE METABOLIC PANEL
ALT: 10 U/L (ref 0–44)
AST: 14 U/L — ABNORMAL LOW (ref 15–41)
Albumin: 4 g/dL (ref 3.5–5.0)
Alkaline Phosphatase: 72 U/L (ref 38–126)
Anion gap: 13 (ref 5–15)
BUN: 7 mg/dL (ref 6–20)
CO2: 26 mmol/L (ref 22–32)
Calcium: 8.8 mg/dL — ABNORMAL LOW (ref 8.9–10.3)
Chloride: 96 mmol/L — ABNORMAL LOW (ref 98–111)
Creatinine, Ser: 0.84 mg/dL (ref 0.44–1.00)
GFR calc Af Amer: >60 mL/min (ref >60)
GFR calc non Af Amer: >60 mL/min (ref >60)
Glucose, Bld: 108 mg/dL — ABNORMAL HIGH (ref 70–99)
Potassium: 2.9 mmol/L — ABNORMAL LOW (ref 3.5–5.1)
Sodium: 135 mmol/L (ref 135–145)
Total Bilirubin: 1 mg/dL (ref 0.3–1.2)
Total Protein: 6.5 g/dL (ref 6.5–8.1)

## 2019-09-15 LAB — LIPASE, BLOOD: Lipase: 32 U/L (ref 11–51)

## 2019-09-15 MED ORDER — SODIUM CHLORIDE 0.9% FLUSH: 3.0000 mL | Freq: Once | INTRAVENOUS | Status: DC

## 2019-09-15 NOTE — ED Notes (Signed)
Pt eloped from waiting area. Called 3x for room placement.  °

## 2019-09-15 NOTE — ED Notes (Signed)
Pt states she is tired of waiting and that she is leaving

## 2019-09-15 NOTE — ED Triage Notes (Signed)
Patient c/o intermittent emesis x  a week.  Patient states she did not have a BM for 3 days and gave herself an enema. Patient states she had good results. Patient also reports that she had a small stool this AM. patient denies abdominal pain.
